# Patient Record
Sex: Male | Born: 1955
Health system: Southern US, Community
[De-identification: ages and names within clinical notes are randomized; demographics above are authoritative.]

## PROBLEM LIST (undated history)

## (undated) DIAGNOSIS — G43909 Migraine, unspecified, not intractable, without status migrainosus: Secondary | ICD-10-CM

## (undated) DIAGNOSIS — H539 Unspecified visual disturbance: Secondary | ICD-10-CM

## (undated) DIAGNOSIS — Z85820 Personal history of malignant melanoma of skin: Secondary | ICD-10-CM

## (undated) DIAGNOSIS — M1990 Unspecified osteoarthritis, unspecified site: Secondary | ICD-10-CM

## (undated) DIAGNOSIS — Z973 Presence of spectacles and contact lenses: Secondary | ICD-10-CM

## (undated) DIAGNOSIS — K219 Gastro-esophageal reflux disease without esophagitis: Secondary | ICD-10-CM

## (undated) DIAGNOSIS — M545 Low back pain, unspecified: Secondary | ICD-10-CM

## (undated) DIAGNOSIS — F419 Anxiety disorder, unspecified: Secondary | ICD-10-CM

## (undated) DIAGNOSIS — N400 Enlarged prostate without lower urinary tract symptoms: Secondary | ICD-10-CM

## (undated) DIAGNOSIS — I10 Essential (primary) hypertension: Secondary | ICD-10-CM

## (undated) DIAGNOSIS — G4700 Insomnia, unspecified: Secondary | ICD-10-CM

## (undated) DIAGNOSIS — C61 Malignant neoplasm of prostate: Secondary | ICD-10-CM

## (undated) DIAGNOSIS — G8929 Other chronic pain: Secondary | ICD-10-CM

## (undated) HISTORY — DX: Other chronic pain: G89.29

## (undated) HISTORY — PX: TONSILLECTOMY: SUR1361

## (undated) HISTORY — DX: Migraine, unspecified, not intractable, without status migrainosus: G43.909

## (undated) HISTORY — DX: Benign prostatic hyperplasia without lower urinary tract symptoms: N40.0

## (undated) HISTORY — DX: Low back pain, unspecified: M54.50

## (undated) HISTORY — DX: Anxiety disorder, unspecified: F41.9

## (undated) HISTORY — DX: Unspecified visual disturbance: H53.9

## (undated) HISTORY — DX: Personal history of malignant melanoma of skin: Z85.820

## (undated) HISTORY — PX: DERMOID CYST  EXCISION: SHX1452

## (undated) HISTORY — DX: Low back pain: M54.5

## (undated) HISTORY — PX: CYST EXCISION: SHX5701

## (undated) HISTORY — PX: FINGER GANGLION CYST EXCISION: SHX1636

## (undated) HISTORY — PX: HERNIA REPAIR: SHX51

## (undated) HISTORY — PX: ROTATOR CUFF REPAIR: SHX139

## (undated) HISTORY — PX: COLONOSCOPY: SHX174

## (undated) HISTORY — PX: EYE SURGERY: SHX253

---

## 1999-05-19 ENCOUNTER — Ambulatory Visit (HOSPITAL_BASED_OUTPATIENT_CLINIC_OR_DEPARTMENT_OTHER): Admission: RE | Admit: 1999-05-19 | Discharge: 1999-05-19 | Payer: Self-pay | Admitting: General Surgery

## 2008-01-26 ENCOUNTER — Encounter: Admission: RE | Admit: 2008-01-26 | Discharge: 2008-01-26 | Payer: Self-pay | Admitting: Family Medicine

## 2008-02-28 ENCOUNTER — Ambulatory Visit (HOSPITAL_BASED_OUTPATIENT_CLINIC_OR_DEPARTMENT_OTHER): Admission: RE | Admit: 2008-02-28 | Discharge: 2008-02-28 | Payer: Self-pay | Admitting: Plastic Surgery

## 2008-02-28 ENCOUNTER — Encounter (INDEPENDENT_AMBULATORY_CARE_PROVIDER_SITE_OTHER): Payer: Self-pay | Admitting: Plastic Surgery

## 2011-07-23 ENCOUNTER — Other Ambulatory Visit: Payer: Self-pay | Admitting: Otolaryngology

## 2011-07-23 DIAGNOSIS — K118 Other diseases of salivary glands: Secondary | ICD-10-CM

## 2011-07-24 ENCOUNTER — Ambulatory Visit
Admission: RE | Admit: 2011-07-24 | Discharge: 2011-07-24 | Disposition: A | Discharge status: Home / Self Care | Payer: Self-pay | Source: Ambulatory Visit | Attending: Otolaryngology | Admitting: Otolaryngology

## 2011-07-24 DIAGNOSIS — K118 Other diseases of salivary glands: Secondary | ICD-10-CM

## 2011-07-24 MED ORDER — IOHEXOL 300 MG/ML  SOLN
75.0000 mL | Freq: Once | INTRAMUSCULAR | Status: AC | PRN
  Administered 2011-07-24: 75 mL via INTRAVENOUS

## 2011-08-12 ENCOUNTER — Encounter (HOSPITAL_BASED_OUTPATIENT_CLINIC_OR_DEPARTMENT_OTHER): Payer: Self-pay | Admitting: *Deleted

## 2011-08-12 NOTE — Progress Notes (Signed)
No labs needed-went to a traveling cardiac testing bus-will bring ekg Bring meds and overnight bag

## 2011-08-13 NOTE — H&P (Signed)
PREOPERATIVE H&P  Chief Complaint: Left neck mass  HPI: Bradley Lambert is a 56 y.o. male who presents for evaluation of left neck mass. This was first noted about 2 months ago when he had a URI. It has persisted and perhaps gotten a little bit larger. Recent CT scan demonstrated a 11x15  mm size left inferior parotid mass or tumor. He's taken to the OR for excision of the parotid mass with facial nerve dissection.  Past Medical History  Diagnosis Date  . No pertinent past medical history   . GERD (gastroesophageal reflux disease)   . Insomnia    Past Surgical History  Procedure Date  . Tonsillectomy   . Dermoid cyst  excision   . Finger ganglion cyst excision   . Colonoscopy   . Eye surgery     radiocaratotomyx4   History   Social History  . Marital Status: Married    Spouse Name: N/A    Number of Children: N/A  . Years of Education: N/A   Social History Main Topics  . Smoking status: Never Smoker   . Smokeless tobacco: Not on file  . Alcohol Use: Yes     1-2 daily  . Drug Use: No  . Sexually Active:    Other Topics Concern  . Not on file   Social History Narrative  . No narrative on file   No family history on file. No Known Allergies Prior to Admission medications   Medication Sig Start Date End Date Taking? Authorizing Provider  aspirin 81 MG tablet Take 81 mg by mouth daily.   Yes Historical Provider, MD  Multiple Vitamins-Minerals (MULTIVITAMIN WITH MINERALS) tablet Take 1 tablet by mouth daily.   Yes Historical Provider, MD  omeprazole (PRILOSEC) 20 MG capsule Take 20 mg by mouth 2 (two) times a week.   Yes Historical Provider, MD  traZODone (DESYREL) 50 MG tablet Take 50 mg by mouth at bedtime.   Yes Historical Provider, MD     Positive ROS: negative  All other systems have been reviewed and were otherwise negative with the exception of those mentioned in the HPI and as above.  Physical Exam: There were no vitals filed for this visit.  General:  Alert, no acute distress Oral: Normal oral mucosa and tonsils Nasal: Clear nasal passages Neck: 1.5 cm mass in the region of the inferior tail of the L parotid gland Ear: Ear canal is clear with normal appearing TMs Cardiovascular: Regular rate and rhythm, no murmur.  Respiratory: Clear to auscultation Neurologic: Alert and oriented x 3   Assessment/Plan: LEFT PAROTID MASS Plan for Procedure(s) LEFT SUPERFICIAL PAROTIDECTOMY WITH FACIAL NERVE DISSECTION   Dillard Cannon, MD 08/13/2011 4:59 PM

## 2011-08-14 ENCOUNTER — Ambulatory Visit (HOSPITAL_BASED_OUTPATIENT_CLINIC_OR_DEPARTMENT_OTHER)
Admission: RE | Admit: 2011-08-14 | Discharge: 2011-08-14 | Disposition: A | Discharge status: Home / Self Care | Payer: PRIVATE HEALTH INSURANCE | Source: Ambulatory Visit | Attending: Otolaryngology | Admitting: Otolaryngology

## 2011-08-14 ENCOUNTER — Encounter (HOSPITAL_BASED_OUTPATIENT_CLINIC_OR_DEPARTMENT_OTHER): Payer: Self-pay | Admitting: Anesthesiology

## 2011-08-14 ENCOUNTER — Ambulatory Visit (HOSPITAL_BASED_OUTPATIENT_CLINIC_OR_DEPARTMENT_OTHER): Payer: PRIVATE HEALTH INSURANCE | Admitting: Anesthesiology

## 2011-08-14 ENCOUNTER — Encounter (HOSPITAL_BASED_OUTPATIENT_CLINIC_OR_DEPARTMENT_OTHER)
Admission: RE | Disposition: A | Discharge status: Home / Self Care | Payer: Self-pay | Source: Ambulatory Visit | Attending: Otolaryngology

## 2011-08-14 DIAGNOSIS — K219 Gastro-esophageal reflux disease without esophagitis: Secondary | ICD-10-CM | POA: Insufficient documentation

## 2011-08-14 DIAGNOSIS — D119 Benign neoplasm of major salivary gland, unspecified: Secondary | ICD-10-CM | POA: Insufficient documentation

## 2011-08-14 HISTORY — DX: Gastro-esophageal reflux disease without esophagitis: K21.9

## 2011-08-14 HISTORY — PX: PAROTIDECTOMY: SHX2163

## 2011-08-14 HISTORY — DX: Insomnia, unspecified: G47.00

## 2011-08-14 SURGERY — EXCISION, PAROTID GLAND
Anesthesia: General | Site: Neck | Laterality: Left | Wound class: Clean

## 2011-08-14 MED ORDER — LIDOCAINE-EPINEPHRINE 1 %-1:100000 IJ SOLN
INTRAMUSCULAR | Status: DC | PRN
  Administered 2011-08-14: 3 mL

## 2011-08-14 MED ORDER — SUCCINYLCHOLINE CHLORIDE 20 MG/ML IJ SOLN
INTRAMUSCULAR | Status: DC | PRN
  Administered 2011-08-14: 100 mg via INTRAVENOUS

## 2011-08-14 MED ORDER — BACITRACIN ZINC 500 UNIT/GM EX OINT
TOPICAL_OINTMENT | CUTANEOUS | Status: DC | PRN
  Administered 2011-08-14: 1 via TOPICAL

## 2011-08-14 MED ORDER — CEFAZOLIN SODIUM 1-5 GM-% IV SOLN
1.0000 g | Freq: Three times a day (TID) | INTRAVENOUS | Status: DC
  Administered 2011-08-14: 1 g via INTRAVENOUS

## 2011-08-14 MED ORDER — CEFAZOLIN SODIUM 1-5 GM-% IV SOLN
INTRAVENOUS | Status: DC | PRN
  Administered 2011-08-14: 2 g via INTRAVENOUS

## 2011-08-14 MED ORDER — LIDOCAINE HCL (CARDIAC) 20 MG/ML IV SOLN
INTRAVENOUS | Status: DC | PRN
  Administered 2011-08-14: 50 mg via INTRAVENOUS

## 2011-08-14 MED ORDER — HYDROMORPHONE HCL PF 1 MG/ML IJ SOLN
0.2500 mg | INTRAMUSCULAR | Status: DC | PRN
  Administered 2011-08-14: 0.5 mg via INTRAVENOUS

## 2011-08-14 MED ORDER — MIDAZOLAM HCL 2 MG/2ML IJ SOLN: 1.0000 mg | INTRAMUSCULAR | Status: DC | PRN

## 2011-08-14 MED ORDER — HYDROCODONE-ACETAMINOPHEN 5-500 MG PO TABS: 1.0000 | ORAL_TABLET | Freq: Four times a day (QID) | ORAL | Status: AC | PRN

## 2011-08-14 MED ORDER — LORAZEPAM 2 MG/ML IJ SOLN: 1.0000 mg | Freq: Once | INTRAMUSCULAR | Status: DC | PRN

## 2011-08-14 MED ORDER — FENTANYL CITRATE 0.05 MG/ML IJ SOLN
INTRAMUSCULAR | Status: DC | PRN
  Administered 2011-08-14: 50 ug via INTRAVENOUS
  Administered 2011-08-14: 25 ug via INTRAVENOUS
  Administered 2011-08-14 (×3): 50 ug via INTRAVENOUS

## 2011-08-14 MED ORDER — ONDANSETRON HCL 4 MG/2ML IJ SOLN
INTRAMUSCULAR | Status: DC | PRN
  Administered 2011-08-14: 4 mg via INTRAVENOUS

## 2011-08-14 MED ORDER — HYDROCODONE-ACETAMINOPHEN 5-325 MG PO TABS
1.0000 | ORAL_TABLET | ORAL | Status: DC | PRN
  Administered 2011-08-14: 1 via ORAL
  Administered 2011-08-14: 2 via ORAL

## 2011-08-14 MED ORDER — KCL IN DEXTROSE-NACL 20-5-0.45 MEQ/L-%-% IV SOLN
INTRAVENOUS | Status: DC
  Administered 2011-08-14: 12:00:00 via INTRAVENOUS

## 2011-08-14 MED ORDER — ACETAMINOPHEN 160 MG/5ML PO SOLN: 650.0000 mg | ORAL | Status: DC | PRN

## 2011-08-14 MED ORDER — LACTATED RINGERS IV SOLN
INTRAVENOUS | Status: DC
  Administered 2011-08-14 (×3): via INTRAVENOUS

## 2011-08-14 MED ORDER — CEPHALEXIN 500 MG PO CAPS: 500.0000 mg | ORAL_CAPSULE | Freq: Two times a day (BID) | ORAL | Status: AC

## 2011-08-14 MED ORDER — ACETAMINOPHEN 650 MG RE SUPP: 650.0000 mg | RECTAL | Status: DC | PRN

## 2011-08-14 MED ORDER — BACITRACIN ZINC 500 UNIT/GM EX OINT: 1.0000 "application " | TOPICAL_OINTMENT | Freq: Three times a day (TID) | CUTANEOUS | Status: DC

## 2011-08-14 MED ORDER — PROPOFOL 10 MG/ML IV EMUL
INTRAVENOUS | Status: DC | PRN
  Administered 2011-08-14: 200 mg via INTRAVENOUS

## 2011-08-14 MED ORDER — MIDAZOLAM HCL 5 MG/5ML IJ SOLN
INTRAMUSCULAR | Status: DC | PRN
  Administered 2011-08-14: 2 mg via INTRAVENOUS

## 2011-08-14 MED ORDER — FENTANYL CITRATE 0.05 MG/ML IJ SOLN: 50.0000 ug | INTRAMUSCULAR | Status: DC | PRN

## 2011-08-14 MED ORDER — DEXAMETHASONE SODIUM PHOSPHATE 4 MG/ML IJ SOLN
INTRAMUSCULAR | Status: DC | PRN
  Administered 2011-08-14: 10 mg via INTRAVENOUS

## 2011-08-14 SURGICAL SUPPLY — 73 items
APPLICATOR COTTON TIP 6IN STRL (MISCELLANEOUS) ×2 IMPLANT
ATTRACTOMAT 16X20 MAGNETIC DRP (DRAPES) ×2 IMPLANT
BENZOIN TINCTURE PRP APPL 2/3 (GAUZE/BANDAGES/DRESSINGS) IMPLANT
BLADE SURG 12 STRL SS (BLADE) ×2 IMPLANT
BLADE SURG 15 STRL LF DISP TIS (BLADE) ×1 IMPLANT
BLADE SURG 15 STRL SS (BLADE) ×1
CANISTER SUCTION 1200CC (MISCELLANEOUS) ×2 IMPLANT
CLEANER CAUTERY TIP 5X5 PAD (MISCELLANEOUS) IMPLANT
CLOTH BEACON ORANGE TIMEOUT ST (SAFETY) ×2 IMPLANT
CORDS BIPOLAR (ELECTRODE) ×2 IMPLANT
COTTONBALL LRG STERILE PKG (GAUZE/BANDAGES/DRESSINGS) ×2 IMPLANT
COVER MAYO STAND STRL (DRAPES) ×2 IMPLANT
COVER TABLE BACK 60X90 (DRAPES) ×2 IMPLANT
DECANTER SPIKE VIAL GLASS SM (MISCELLANEOUS) IMPLANT
DERMABOND ADVANCED (GAUZE/BANDAGES/DRESSINGS)
DERMABOND ADVANCED .7 DNX12 (GAUZE/BANDAGES/DRESSINGS) IMPLANT
DRAIN CHANNEL 7F FF FLAT (WOUND CARE) IMPLANT
DRAIN JACKSON RD 7FR 3/32 (WOUND CARE) IMPLANT
DRAIN JP 10F RND SILICONE (MISCELLANEOUS) ×2 IMPLANT
DRAIN PENROSE 1/4X12 LTX STRL (WOUND CARE) IMPLANT
DRAIN SNY WOU 7FLT (WOUND CARE) IMPLANT
DRAPE SURG 17X23 STRL (DRAPES) ×2 IMPLANT
DRAPE U-SHAPE 76X120 STRL (DRAPES) ×2 IMPLANT
ELECT COATED BLADE 2.86 ST (ELECTRODE) ×2 IMPLANT
ELECT REM PT RETURN 9FT ADLT (ELECTROSURGICAL) ×2
ELECTRODE REM PT RTRN 9FT ADLT (ELECTROSURGICAL) ×1 IMPLANT
EVACUATOR SILICONE 100CC (DRAIN) ×2 IMPLANT
GAUZE SPONGE 4X4 12PLY STRL LF (GAUZE/BANDAGES/DRESSINGS) IMPLANT
GAUZE SPONGE 4X4 16PLY XRAY LF (GAUZE/BANDAGES/DRESSINGS) ×2 IMPLANT
GLOVE ECLIPSE 6.5 STRL STRAW (GLOVE) ×2 IMPLANT
GLOVE INDICATOR 7.0 STRL GRN (GLOVE) ×2 IMPLANT
GLOVE SKINSENSE NS SZ7.5 (GLOVE)
GLOVE SKINSENSE STRL SZ7.5 (GLOVE) IMPLANT
GLOVE SS BIOGEL STRL SZ 7.5 (GLOVE) ×1 IMPLANT
GLOVE SUPERSENSE BIOGEL SZ 7.5 (GLOVE) ×1
GOWN PREVENTION PLUS XLARGE (GOWN DISPOSABLE) ×2 IMPLANT
GOWN PREVENTION PLUS XXLARGE (GOWN DISPOSABLE) ×2 IMPLANT
LOCATOR NERVE 3 VOLT (DISPOSABLE) IMPLANT
NEEDLE HYPO 25X1 1.5 SAFETY (NEEDLE) ×2 IMPLANT
NS IRRIG 1000ML POUR BTL (IV SOLUTION) ×2 IMPLANT
PACK BASIN DAY SURGERY FS (CUSTOM PROCEDURE TRAY) ×2 IMPLANT
PAD CLEANER CAUTERY TIP 5X5 (MISCELLANEOUS)
PENCIL BUTTON HOLSTER BLD 10FT (ELECTRODE) ×2 IMPLANT
PIN SAFETY STERILE (MISCELLANEOUS) IMPLANT
SHEET MEDIUM DRAPE 40X70 STRL (DRAPES) IMPLANT
SLEEVE SCD COMPRESS KNEE MED (MISCELLANEOUS) ×2 IMPLANT
SPONGE INTESTINAL PEANUT (DISPOSABLE) ×2 IMPLANT
STAPLER VISISTAT 35W (STAPLE) IMPLANT
STRIP CLOSURE SKIN 1/2X4 (GAUZE/BANDAGES/DRESSINGS) IMPLANT
SUCTION FRAZIER TIP 10 FR DISP (SUCTIONS) IMPLANT
SUT CHROMIC 3 0 PS 2 (SUTURE) ×2 IMPLANT
SUT CHROMIC 3 0 TIES (SUTURE) IMPLANT
SUT ETHILON 3 0 PS 1 (SUTURE) ×2 IMPLANT
SUT ETHILON 4 0 PS 2 18 (SUTURE) IMPLANT
SUT ETHILON 5 0 P 3 18 (SUTURE) ×1
SUT NYLON ETHILON 5-0 P-3 1X18 (SUTURE) ×1 IMPLANT
SUT SILK 2 0 FS (SUTURE) ×6 IMPLANT
SUT SILK 2 0 TIES 17X18 (SUTURE) ×1
SUT SILK 2-0 18XBRD TIE BLK (SUTURE) ×1 IMPLANT
SUT SILK 3 0 PS 1 (SUTURE) IMPLANT
SUT SILK 3 0 SH 30 (SUTURE) IMPLANT
SUT SILK 3 0 TIES 17X18 (SUTURE)
SUT SILK 3-0 18XBRD TIE BLK (SUTURE) IMPLANT
SUT SILK 4 0 TIES 17X18 (SUTURE) ×2 IMPLANT
SWAB CULTURE LIQ STUART DBL (MISCELLANEOUS) IMPLANT
SYR BULB 3OZ (MISCELLANEOUS) ×2 IMPLANT
SYR CONTROL 10ML LL (SYRINGE) ×2 IMPLANT
TAPE CLOTH SURG 4X10 WHT LF (GAUZE/BANDAGES/DRESSINGS) ×2 IMPLANT
TOWEL OR 17X24 6PK STRL BLUE (TOWEL DISPOSABLE) ×4 IMPLANT
TRAY DSU PREP LF (CUSTOM PROCEDURE TRAY) ×2 IMPLANT
TUBE ANAEROBIC SPECIMEN COL (MISCELLANEOUS) IMPLANT
TUBE CONNECTING 20X1/4 (TUBING) ×2 IMPLANT
WATER STERILE IRR 1000ML POUR (IV SOLUTION) IMPLANT

## 2011-08-14 NOTE — Transfer of Care (Signed)
Immediate Anesthesia Transfer of Care Note  Patient: Bradley Lambert  Procedure(s) Performed: Procedure(s) (LRB): PAROTIDECTOMY (Left)  Patient Location: PACU  Anesthesia Type: General  Level of Consciousness: awake, alert  and oriented  Airway & Oxygen Therapy: Patient Spontanous Breathing and Patient connected to face mask oxygen  Post-op Assessment: Report given to PACU RN and Post -op Vital signs reviewed and stable  Post vital signs: Reviewed and stable  Complications: No apparent anesthesia complications

## 2011-08-14 NOTE — Anesthesia Preprocedure Evaluation (Signed)
Anesthesia Evaluation  Patient identified by MRN, date of birth, ID band Patient awake    Reviewed: Allergy & Precautions, H&P , NPO status , Patient's Chart, lab work & pertinent test results  Airway Mallampati: I TM Distance: >3 FB Neck ROM: Full    Dental   Pulmonary    Pulmonary exam normal       Cardiovascular     Neuro/Psych    GI/Hepatic GERD-  Medicated and Controlled,  Endo/Other    Renal/GU      Musculoskeletal   Abdominal   Peds  Hematology   Anesthesia Other Findings   Reproductive/Obstetrics                           Anesthesia Physical Anesthesia Plan  ASA: II  Anesthesia Plan: General   Post-op Pain Management:    Induction: Intravenous  Airway Management Planned: Oral ETT  Additional Equipment:   Intra-op Plan:   Post-operative Plan: Extubation in OR  Informed Consent: I have reviewed the patients History and Physical, chart, labs and discussed the procedure including the risks, benefits and alternatives for the proposed anesthesia with the patient or authorized representative who has indicated his/her understanding and acceptance.     Plan Discussed with: CRNA and Surgeon  Anesthesia Plan Comments:         Anesthesia Quick Evaluation

## 2011-08-14 NOTE — Interval H&P Note (Signed)
History and Physical Interval Note:  08/14/2011 7:37 AM  Bradley Lambert  has presented today for surgery, with the diagnosis of PAROTID MASS  The various methods of treatment have been discussed with the patient and family. After consideration of risks, benefits and other options for treatment, the patient has consented to  Procedure(s) (LRB): PAROTIDECTOMY (Left) as a surgical intervention .  The patient's history has been reviewed, patient examined, no change in status, stable for surgery.  I have reviewed the patients' chart and labs.  Questions were answered to the patient's satisfaction.     [name]

## 2011-08-14 NOTE — Discharge Instructions (Addendum)
Keep incision clean and apply antibiotic ointment to  incision site. Keflex BID for 5 days Tylenol or Vicodin prn pain Call office for follow up in 6-7 days      Post Anesthesia Home Care Instructions  Activity: Get plenty of rest for the remainder of the day. A responsible adult should stay with you for 24 hours following the procedure.  For the next 24 hours, DO NOT: -Drive a car -Advertising copywriter -Drink alcoholic beverages -Take any medication unless instructed by your physician -Make any legal decisions or sign important papers.  Meals: Start with liquid foods such as gelatin or soup. Progress to regular foods as tolerated. Avoid greasy, spicy, heavy foods. If nausea and/or vomiting occur, drink only clear liquids until the nausea and/or vomiting subsides. Call your physician if vomiting continues.  Special Instructions/Symptoms: Your throat may feel dry or sore from the anesthesia or the breathing tube placed in your throat during surgery. If this causes discomfort, gargle with warm salt water. The discomfort should disappear within 24 hours.    Call your surgeon if you experience:   1.  Fever over 101.0. 2.  Inability to urinate. 3.  Nausea and/or vomiting. 4.  Extreme swelling or bruising at the surgical site. 5.  Continued bleeding from the incision. 6.  Increased pain, redness or drainage from the incision. 7.  Problems related to your pain medication.

## 2011-08-14 NOTE — Op Note (Signed)
NAMEQUANTE, PETTRY NO.:  [id number]  MEDICAL RECORD NO.:  [id number]  LOCATION:                                 FACILITY:  PHYSICIAN:  Kristine Garbe. Ezzard Standing, M.D. DATE OF BIRTH:  DATE OF PROCEDURE:  08/14/2011 DATE OF DISCHARGE:                              OPERATIVE REPORT   PREOPERATIVE DIAGNOSIS:  Left parotid mass.  POSTOPERATIVE DIAGNOSIS:  Left parotid mass.  OPERATION:  Left superficial parotidectomy with facial nerve dissection. Excision of left inferior parotid mass.  SURGEON:  Kristine Garbe. Ezzard Standing, MD  ANESTHESIA:  General endotracheal.  COMPLICATIONS:  None.  DRAINS:  A 10-French JP drain.  BRIEF CLINICAL NOTE:  Bradley Lambert is a 56 year old gentleman who initially noted a node in the left neck about 3 months ago.  It is gradually gotten a little bit larger.  Followup CT scan revealed a probable parotid mass in the inferior aspect of the parotid gland just inferior and posterior to the angle of the jaw.  It measured approximately 10 x 15 mm in size.  He was taken to the operative room at this time for superficial parotidectomy and facial nerve dissection with excision of the left inferior parotid mass.  DESCRIPTION OF PROCEDURE:  After adequate endotracheal anesthesia, left parotid incision was marked out and injected with Xylocaine with epinephrine for local anesthetic and hemostasis.  Standard parotid incision was made around the earlobe and extended down to the neck. First, flap was elevated anteriorly, inferiorly over the parotid fascia. The mass was little bit difficult to palpate, but was located just inferior to the angle of the jaw.  Next, dissection was carried down in front of the ear cartilage down to identify the facial nerve.  The trunk of the facial nerve was identified in its normal anatomical position just inferior and lateral to the styloid process.  The trunk of the facial nerve was dissected out in the inferior  branch, which passed in close proximity to the mass was followed and dissected out.  Hemostasis was obtained with 4-0 silk ligatures and bipolar cautery.  The mass was just lateral to the posterior facial vein, was encapsulated and located at the anterior inferior aspect of the left parotid gland.  The inferior aspect of the left parotid gland was resected along with the mass.  A single stitch was placed to the specimen to identify the inferior aspect of the parotid gland.  The specimen was sent as saline fresh to pathology.  Wound was irrigated with saline.  Hemostasis was obtained with bipolar cautery, and then a 10-French round perforated JP drain was brought through a separate stab incision posteriorly and the defect was closed with 3-0 chromic sutures subcutaneously and 5-0 nylon reapproximated the skin edges.  Bacitracin ointment and dressing was applied. Eon will be observed this afternoon and probable discharge this evening or tomorrow a.m. depending on JP output.  He will be discharged home on Vicodin 1-2 q.4 hours p.r.n. pain and Tylenol for pain.  I will have him follow up in my office in 1 week for recheck and to review pathology.          ______________________________ Kristine Garbe Ezzard Standing,  M.D.     CEN/MEDQ  D:  08/14/2011  T:  08/14/2011  Job:  [id number]  cc:   Caryn Bee L. Little, M.D.

## 2011-08-14 NOTE — Anesthesia Postprocedure Evaluation (Signed)
  Anesthesia Post-op Note  Patient: Bradley Lambert  Procedure(s) Performed: Procedure(s) (LRB): PAROTIDECTOMY (Left)  Patient Location: PACU  Anesthesia Type: General  Level of Consciousness: awake  Airway and Oxygen Therapy: Patient Spontanous Breathing  Post-op Pain: mild  Post-op Assessment: Post-op Vital signs reviewed, Patient's Cardiovascular Status Stable, Respiratory Function Stable and Patent Airway  Post-op Vital Signs: Reviewed and stable  Complications: No apparent anesthesia complications

## 2011-08-14 NOTE — Anesthesia Procedure Notes (Signed)
Procedure Name: Intubation Date/Time: 08/14/2011 7:52 AM Performed by: Zenia Resides D Pre-anesthesia Checklist: Patient identified, Emergency Drugs available, Suction available, Patient being monitored and Timeout performed Patient Re-evaluated:Patient Re-evaluated prior to inductionOxygen Delivery Method: Circle System Utilized Preoxygenation: Pre-oxygenation with 100% oxygen Intubation Type: IV induction Ventilation: Mask ventilation without difficulty Laryngoscope Size: Mac and 3 Grade View: Grade I Tube type: Oral Number of attempts: 1 Airway Equipment and Method: stylet and oral airway Placement Confirmation: ETT inserted through vocal cords under direct vision,  positive ETCO2 and breath sounds checked- equal and bilateral Secured at: 23 cm Tube secured with: Tape Dental Injury: Teeth and Oropharynx as per pre-operative assessment

## 2011-08-14 NOTE — Progress Notes (Signed)
Doing well post op with no complaints. Normal facial nerve function. JP drain with minimal serous effusion.  [facility]

## 2011-08-14 NOTE — Brief Op Note (Signed)
08/14/2011  10:35 AM  PATIENT:  Bradley Lambert  56 y.o. male  PRE-OPERATIVE DIAGNOSIS:  LEFT PAROTID MASS  POST-OPERATIVE DIAGNOSIS:  LEFT PAROTID MASS  PROCEDURE:  Procedure(s) (LRB): PAROTIDECTOMY (Left)  SURGEON:  Surgeon(s) and Role:    * Drema Halon, MD - Primary  PHYSICIAN ASSISTANT:   ASSISTANTS: none   ANESTHESIA:   general  EBL:  Total I/O In: 2000 [I.V.:2000] Out: -   BLOOD ADMINISTERED:none  DRAINS: (16fr) Jackson-Pratt drain(s) with closed bulb suction in the    LOCAL MEDICATIONS USED:  NONE  SPECIMEN:  Source of Specimen:  L inferior parotid mass  DISPOSITION OF SPECIMEN:  PATHOLOGY  COUNTS:  YES  TOURNIQUET:  * No tourniquets in log *  DICTATION: .Other Dictation: Dictation Number [id number]  PLAN OF CARE: Admit for overnight observation  PATIENT DISPOSITION:  PACU - hemodynamically stable.   Delay start of Pharmacological VTE agent (>24hrs) due to surgical blood loss or risk of bleeding: yes

## 2011-08-17 NOTE — Anesthesia Postprocedure Evaluation (Signed)
  Anesthesia Post-op Note  Patient: Bradley Lambert  Procedure(s) Performed: Procedure(s) (LRB): PAROTIDECTOMY (Left)  Patient Location: PACU  Anesthesia Type: General  Level of Consciousness: awake and alert   Airway and Oxygen Therapy: Patient Spontanous Breathing  Post-op Pain: mild  Post-op Assessment: Post-op Vital signs reviewed, Patient's Cardiovascular Status Stable, Respiratory Function Stable, Patent Airway, No signs of Nausea or vomiting and Pain level controlled  Post-op Vital Signs: stable  Complications: No apparent anesthesia complications

## 2011-08-18 ENCOUNTER — Encounter (HOSPITAL_BASED_OUTPATIENT_CLINIC_OR_DEPARTMENT_OTHER): Payer: Self-pay | Admitting: Otolaryngology

## 2012-08-04 ENCOUNTER — Other Ambulatory Visit: Payer: Self-pay | Admitting: Family Medicine

## 2012-08-04 DIAGNOSIS — M549 Dorsalgia, unspecified: Secondary | ICD-10-CM

## 2012-08-09 ENCOUNTER — Ambulatory Visit
Admission: RE | Admit: 2012-08-09 | Discharge: 2012-08-09 | Disposition: A | Discharge status: Home / Self Care | Payer: PRIVATE HEALTH INSURANCE | Source: Ambulatory Visit | Attending: Family Medicine | Admitting: Family Medicine

## 2012-08-09 DIAGNOSIS — M549 Dorsalgia, unspecified: Secondary | ICD-10-CM

## 2012-09-01 ENCOUNTER — Other Ambulatory Visit (HOSPITAL_COMMUNITY): Payer: Self-pay | Admitting: Urology

## 2012-09-01 DIAGNOSIS — R972 Elevated prostate specific antigen [PSA]: Secondary | ICD-10-CM

## 2012-09-15 ENCOUNTER — Ambulatory Visit (HOSPITAL_COMMUNITY): Payer: PRIVATE HEALTH INSURANCE

## 2012-09-15 ENCOUNTER — Ambulatory Visit (HOSPITAL_COMMUNITY)
Admission: RE | Admit: 2012-09-15 | Discharge: 2012-09-15 | Disposition: A | Discharge status: Home / Self Care | Payer: PRIVATE HEALTH INSURANCE | Source: Ambulatory Visit | Attending: Urology | Admitting: Urology

## 2012-09-15 DIAGNOSIS — R972 Elevated prostate specific antigen [PSA]: Secondary | ICD-10-CM | POA: Insufficient documentation

## 2012-09-15 DIAGNOSIS — K4090 Unilateral inguinal hernia, without obstruction or gangrene, not specified as recurrent: Secondary | ICD-10-CM | POA: Insufficient documentation

## 2012-09-15 DIAGNOSIS — N400 Enlarged prostate without lower urinary tract symptoms: Secondary | ICD-10-CM | POA: Insufficient documentation

## 2012-09-15 MED ORDER — GADOBENATE DIMEGLUMINE 529 MG/ML IV SOLN
20.0000 mL | Freq: Once | INTRAVENOUS | Status: AC | PRN
  Administered 2012-09-15: 20 mL via INTRAVENOUS

## 2012-11-08 ENCOUNTER — Other Ambulatory Visit: Payer: Self-pay | Admitting: Urology

## 2012-11-29 ENCOUNTER — Encounter (HOSPITAL_COMMUNITY): Payer: Self-pay | Admitting: Pharmacy Technician

## 2012-12-01 ENCOUNTER — Ambulatory Visit (HOSPITAL_COMMUNITY)
Admission: RE | Admit: 2012-12-01 | Discharge: 2012-12-01 | Disposition: A | Discharge status: Home / Self Care | Payer: PRIVATE HEALTH INSURANCE | Source: Ambulatory Visit | Attending: Urology | Admitting: Urology

## 2012-12-01 ENCOUNTER — Encounter (HOSPITAL_COMMUNITY): Payer: Self-pay

## 2012-12-01 ENCOUNTER — Encounter (HOSPITAL_COMMUNITY)
Admission: RE | Admit: 2012-12-01 | Discharge: 2012-12-01 | Disposition: A | Discharge status: Home / Self Care | Payer: PRIVATE HEALTH INSURANCE | Source: Ambulatory Visit | Attending: Urology | Admitting: Urology

## 2012-12-01 DIAGNOSIS — C61 Malignant neoplasm of prostate: Secondary | ICD-10-CM | POA: Insufficient documentation

## 2012-12-01 DIAGNOSIS — Z01812 Encounter for preprocedural laboratory examination: Secondary | ICD-10-CM | POA: Insufficient documentation

## 2012-12-01 DIAGNOSIS — Z01810 Encounter for preprocedural cardiovascular examination: Secondary | ICD-10-CM | POA: Insufficient documentation

## 2012-12-01 DIAGNOSIS — Z01818 Encounter for other preprocedural examination: Secondary | ICD-10-CM | POA: Insufficient documentation

## 2012-12-01 HISTORY — PX: MELANOMA EXCISION: SHX5266

## 2012-12-01 HISTORY — DX: Essential (primary) hypertension: I10

## 2012-12-01 HISTORY — DX: Unspecified osteoarthritis, unspecified site: M19.90

## 2012-12-01 HISTORY — DX: Malignant neoplasm of prostate: C61

## 2012-12-01 LAB — BASIC METABOLIC PANEL
BUN: 15 mg/dL (ref 6–23)
CO2: 24 mEq/L (ref 19–32)
Chloride: 104 mEq/L (ref 96–112)
Glucose, Bld: 101 mg/dL — ABNORMAL HIGH (ref 70–99)
Potassium: 3.9 mEq/L (ref 3.5–5.1)
Sodium: 138 mEq/L (ref 135–145)

## 2012-12-01 LAB — CBC
HCT: 40.9 % (ref 39.0–52.0)
Hemoglobin: 14.5 g/dL (ref 13.0–17.0)
MCH: 31.8 pg (ref 26.0–34.0)
MCHC: 35.5 g/dL (ref 30.0–36.0)
MCV: 89.7 fL (ref 78.0–100.0)
RBC: 4.56 MIL/uL (ref 4.22–5.81)

## 2012-12-01 NOTE — Patient Instructions (Addendum)
Bradley Lambert  12/01/2012   Your procedure is scheduled on:  10-16 -2014  Report to [facility] at     0800   AM .  Call this number if you have problems the morning of surgery: [phone]  Or Presurgical Testing [phone](Tyner Codner)   Remember: Follow any bowel prep instructions per MD office.    Do not eat food:After Midnight.    Take these medicines the morning of surgery with A SIP OF WATER: Omeprazole.   Do not wear jewelry, make-up or nail polish.  Do not wear lotions, powders, or perfumes. You may wear deodorant.  Do not shave 12 hours prior to first CHG shower(legs and under arms).(face and neck okay.)  Do not bring valuables to the hospital.  Contacts, dentures or bridgework,body piercing,  may not be worn into surgery.  Leave suitcase in the car. After surgery it may be brought to your room.  For patients admitted to the hospital, checkout time is 11:00 AM the day of discharge.   Patients discharged the day of surgery will not be allowed to drive home. Must have responsible person with you x 24 hours once discharged.  Name and phone number of your driver: Jerelyn Charles 161- [phone] cell  Special Instructions: CHG(Chlorhedine 4%-"Hibiclens","Betasept","Aplicare") Shower Use Special Wash: see special instructions.(avoid face and genitals)   Please read over the following fact sheets that you were given:  Transfusion fact sheet, Incentive Spirometry Instruction.    Failure to follow these instructions may result in Cancellation of your surgery.   Patient signature_______________________________________________________

## 2012-12-01 NOTE — Pre-Procedure Instructions (Signed)
12-01-12 EKg/CXR done today

## 2012-12-07 NOTE — H&P (Signed)
Chief Complaint  Prostate Cancer   Reason For Visit  Reason for consult: To discuss treatment options for prostate cancer and specifically to consider a robotic prostatectomy. Physician requesting consult: Dr. Karma Greaser PCP: Dr. Catha Gosselin   History of Present Illness  [name] is a 57 year old who was noted to have an elevated PSA of 5.6 which prompted a prostate biopsy on 05/12/12 which demonstrated atypical glands suspicious for malignancy at the right apex. An MRI of the prostate was performed on 09/15/12 and indicated a concerning lesion at the right medial base with indeterminate right seminal vesicle invasion. A repeat biopsy was performed on 10/26/12 with additional cores taken on the right side of the prostate based on his prior atypia and MRI findings. This demonstrated 5 out of 14 cores positive for Gleason 3+3=6 adenocarcinoma. He has no family history of prostate cancer. He has no major medical comorbidities.  He has a history of BPH with a prostate volume measuring between 98 and 130 cc based on his prostate ultrasound measurments.  He has had voiding symptoms and has been treated with various alpha blockers and 5 alpha reductase inhibitors but stopped all these medications due to retrograde ejaculation.  He did undergo a laparoscopic hernia repair approximately 15 years ago.  TNM stage: cT1c Nx Mx PSA: 5.6 Gleason score: 3+3=6 Biopsy (10/26/12): 5/14 cores positive -- R apex (2/2 cores - <5%, <5%), R mid (5%), R lateral mid (15%), R lateral base (30%, PNI) Prostate volume: 98 cc  Nomogram OC disease: 87% EPE: 9% SVI: 1% LNI: 1.4% PFS (surgery): 97% at 5 years, 96% at 10 years  Urinary function: He does have significant baseline voiding symptoms including a sense of incomplete emptying, urinary frequency, weak stream, urgency, straining, and nocturia.  He has previously been treated with multiple medical therapies and did receive mild benefit felt the side effects were  not worth the benefit.  IPSS is 28. Erectile function: He denies erectile dysfunction.  SHIM score is 23.   Past Medical History Problems  1. History of  Arthritis V13.4 2. History of  Hypertension 401.9 3. History of  Skin Cancer V10.83  Surgical History Problems  1. History of  Hernia Repair Bilateral 2. History of  Laparoscopy Repair Of Hernia  Current Meds 1. Aspirin 81 MG Oral Tablet; Therapy: (Recorded:25Feb2014) to 2. Losartan Potassium 100 MG Oral Tablet; Therapy: (Recorded:25Feb2014) to 3. Omeprazole 20 MG Oral Capsule Delayed Release; Therapy: (Recorded:25Feb2014) to 4. TraZODone HCl 50 MG Oral Tablet; Therapy: (Recorded:25Feb2014) to  Allergies Medication  1. No Known Drug Allergies  Family History Problems  1. Family history of  Diabetes Mellitus V18.0 2. Paternal history of  Esophageal Cancer V16.0 Denied  3. Family history of  Prostate Cancer  Social History Problems    Alcohol Use 3 per day   Marital History - Currently Married   Never A Smoker   Occupation: Advice worker  Review of Systems Genitourinary, constitutional, skin, eye, otolaryngeal, hematologic/lymphatic, cardiovascular, pulmonary, endocrine, musculoskeletal, gastrointestinal, neurological and psychiatric system(s) were reviewed and pertinent findings if present are noted.    Vitals Vital Signs [Data Includes: Last 1 Day]  16Sep2014 08:00AM  Blood Pressure: 136 / 89 Heart Rate: 79  Physical Exam Constitutional: Well nourished and well developed . No acute distress.  ENT:. The ears and nose are normal in appearance.  Neck: The appearance of the neck is normal and no neck mass is present.  Pulmonary: No respiratory distress, normal respiratory rhythm and  effort and clear bilateral breath sounds.  Cardiovascular: Heart rate and rhythm are normal . No peripheral edema.  Abdomen: periumbilical incision site(s) well healed. The abdomen is soft and nontender. No masses are palpated.  No CVA tenderness. No hernias are palpable. No hepatosplenomegaly noted.  Rectal: Rectal exam demonstrates normal sphincter tone, no tenderness and no masses. Prostate size is estimated to be 80 g. The prostate has no nodularity and is not tender. The left seminal vesicle is nonpalpable. The right seminal vesicle is nonpalpable. The perineum is normal on inspection.  Lymphatics: The femoral and inguinal nodes are not enlarged or tender.  Skin: Normal skin turgor, no visible rash and no visible skin lesions.  Neuro/Psych:. Mood and affect are appropriate.    Results/Data Urine [Data Includes: Last 1 Day]   16Sep2014  COLOR YELLOW   APPEARANCE CLEAR   SPECIFIC GRAVITY 1.020   pH 5.5   GLUCOSE NEG mg/dL  BILIRUBIN NEG   KETONE NEG mg/dL  BLOOD LARGE   PROTEIN NEG mg/dL  UROBILINOGEN 0.2 mg/dL  NITRITE NEG   LEUKOCYTE ESTERASE NEG   SQUAMOUS EPITHELIAL/HPF RARE   WBC NONE SEEN WBC/hpf  RBC 3-6 RBC/hpf  BACTERIA RARE   CRYSTALS NONE SEEN   CASTS NONE SEEN     I have reviewed his medical records, PSA results, and pathology report.  I also independently reviewed his MRI from July.  Findings are as dictated above.   Assessment Assessed  1. Prostate Cancer 185  Plan Health Maintenance (V70.0)  1. UA With REFLEX  Done: 16Sep2014 07:52AM Prostate Cancer (185)  2. Follow-up Schedule Surgery Office  Follow-up  Done: 16Sep2014 3. PT/OT Referral Referral  Referral  Requested for: 23Sep2014  Discussion/Summary  1.  Prostate cancer: I had a detailed discussion with Mr. Garraway and his wife today.  Considering his volume of disease, I did recommend therapy of curative intent.  He does have a significantly enlarged prostate with significant voiding symptoms and I therefore favored surgical therapy over radiation therapy for this reason.  He would like to proceed with surgical therapy and will be tentatively scheduled for a bilateral nerve sparing robotic-assisted laparoscopic radical  prostatectomy.  He understands the potential risks associated with his prior laparoscopic hernia repair and slightly increased risk of open surgical conversion, bladder injury, and extended operative time that may be related to his prior surgical history.   The patient was counseled about the natural history of prostate cancer and the standard treatment options that are available for prostate cancer. It was explained to him how his age and life expectancy, clinical stage, Gleason score, and PSA affect his prognosis, the decision to proceed with additional staging studies, as well as how that information influences recommended treatment strategies. We discussed the roles for active surveillance, radiation therapy, surgical therapy, androgen deprivation, as well as ablative therapy options for the treatment of prostate cancer as appropriate to his individual cancer situation. We discussed the risks and benefits of these options with regard to their impact on cancer control and also in terms of potential adverse events, complications, and impact on quiality of life particularly related to urinary, bowel, and sexual function. The patient was encouraged to ask questions throughout the discussion today and all questions were answered to his stated satisfaction. In addition, the patient was provided with and/or directed to appropriate resources and literature for further education about prostate cancer and treatment options.   We discussed surgical therapy for prostate cancer including the different available surgical  approaches. We discussed, in detail, the risks and expectations of surgery with regard to cancer control, urinary control, and erectile function as well as the expected postoperative recovery process. Additional risks of surgery including but not limited to bleeding, infection, hernia formation, nerve damage, lymphocele formation, bowel/rectal injury potentially necessitating colostomy, damage to the  urinary tract resulting in urine leakage, urethral stricture, and the cardiopulmonary risks such as myocardial infarction, stroke, death, venothromboembolism, etc. were explained. The risk of open surgical conversion for robotic/laparoscopic prostatectomy was also discussed.   Cc: Dr. Jerilee Field Dr. Catha Gosselin    SignaturesElectronically signed by : Heloise Purpura, M.D.; Nov 08 2012  6:34PM

## 2012-12-08 ENCOUNTER — Ambulatory Visit (HOSPITAL_COMMUNITY): Payer: PRIVATE HEALTH INSURANCE | Admitting: Anesthesiology

## 2012-12-08 ENCOUNTER — Encounter (HOSPITAL_COMMUNITY): Payer: PRIVATE HEALTH INSURANCE | Admitting: Anesthesiology

## 2012-12-08 ENCOUNTER — Encounter (HOSPITAL_COMMUNITY): Payer: Self-pay | Admitting: *Deleted

## 2012-12-08 ENCOUNTER — Encounter (HOSPITAL_COMMUNITY)
Admission: RE | Disposition: A | Discharge status: Home / Self Care | Payer: Self-pay | Source: Ambulatory Visit | Attending: Urology

## 2012-12-08 ENCOUNTER — Inpatient Hospital Stay (HOSPITAL_COMMUNITY)
Admission: RE | Admit: 2012-12-08 | Discharge: 2012-12-09 | DRG: 708 | Disposition: A | Discharge status: Home / Self Care | Payer: PRIVATE HEALTH INSURANCE | Source: Ambulatory Visit | Attending: Urology | Admitting: Urology

## 2012-12-08 DIAGNOSIS — C61 Malignant neoplasm of prostate: Principal | ICD-10-CM | POA: Diagnosis present

## 2012-12-08 DIAGNOSIS — Z79899 Other long term (current) drug therapy: Secondary | ICD-10-CM

## 2012-12-08 DIAGNOSIS — Z7982 Long term (current) use of aspirin: Secondary | ICD-10-CM

## 2012-12-08 DIAGNOSIS — I10 Essential (primary) hypertension: Secondary | ICD-10-CM | POA: Diagnosis present

## 2012-12-08 HISTORY — PX: ROBOT ASSISTED LAPAROSCOPIC RADICAL PROSTATECTOMY: SHX5141

## 2012-12-08 LAB — TYPE AND SCREEN: ABO/RH(D): AB NEG

## 2012-12-08 LAB — HEMOGLOBIN AND HEMATOCRIT, BLOOD: Hemoglobin: 13.6 g/dL (ref 13.0–17.0)

## 2012-12-08 SURGERY — ROBOTIC ASSISTED LAPAROSCOPIC RADICAL PROSTATECTOMY LEVEL 3
Anesthesia: General | Site: Abdomen | Wound class: Clean

## 2012-12-08 MED ORDER — MIDAZOLAM HCL 5 MG/5ML IJ SOLN
INTRAMUSCULAR | Status: DC | PRN
  Administered 2012-12-08: 2 mg via INTRAVENOUS

## 2012-12-08 MED ORDER — BELLADONNA ALKALOIDS-OPIUM 16.2-60 MG RE SUPP
1.0000 | Freq: Four times a day (QID) | RECTAL | Status: DC | PRN
  Administered 2012-12-08: 1 via RECTAL
  Filled 2012-12-08: qty 1

## 2012-12-08 MED ORDER — INDIGOTINDISULFONATE SODIUM 8 MG/ML IJ SOLN
INTRAMUSCULAR | Status: DC | PRN
  Administered 2012-12-08: 5 mL via INTRAVENOUS

## 2012-12-08 MED ORDER — LACTATED RINGERS IV SOLN
INTRAVENOUS | Status: DC
  Administered 2012-12-08: 1000 mL via INTRAVENOUS

## 2012-12-08 MED ORDER — ONDANSETRON HCL 4 MG/2ML IJ SOLN
INTRAMUSCULAR | Status: DC | PRN
  Administered 2012-12-08: 4 mg via INTRAMUSCULAR

## 2012-12-08 MED ORDER — PROPOFOL 10 MG/ML IV BOLUS
INTRAVENOUS | Status: DC | PRN
  Administered 2012-12-08: 150 mg via INTRAVENOUS

## 2012-12-08 MED ORDER — DOCUSATE SODIUM 100 MG PO CAPS
100.0000 mg | ORAL_CAPSULE | Freq: Two times a day (BID) | ORAL | Status: DC
  Administered 2012-12-08 – 2012-12-09 (×2): 100 mg via ORAL
  Filled 2012-12-08 (×3): qty 1

## 2012-12-08 MED ORDER — ACETAMINOPHEN 325 MG PO TABS
650.0000 mg | ORAL_TABLET | ORAL | Status: DC | PRN
  Administered 2012-12-09: 650 mg via ORAL
  Filled 2012-12-08: qty 2

## 2012-12-08 MED ORDER — CEFAZOLIN SODIUM-DEXTROSE 2-3 GM-% IV SOLR
2.0000 g | INTRAVENOUS | Status: AC
  Administered 2012-12-08: 2 g via INTRAVENOUS

## 2012-12-08 MED ORDER — FENTANYL CITRATE 0.05 MG/ML IJ SOLN
INTRAMUSCULAR | Status: DC | PRN
  Administered 2012-12-08: 50 ug via INTRAVENOUS
  Administered 2012-12-08 (×2): 100 ug via INTRAVENOUS
  Administered 2012-12-08: 50 ug via INTRAVENOUS
  Administered 2012-12-08 (×2): 100 ug via INTRAVENOUS

## 2012-12-08 MED ORDER — KETOROLAC TROMETHAMINE 15 MG/ML IJ SOLN
INTRAMUSCULAR | Status: AC
  Filled 2012-12-08: qty 1

## 2012-12-08 MED ORDER — BUPIVACAINE-EPINEPHRINE PF 0.25-1:200000 % IJ SOLN
INTRAMUSCULAR | Status: AC
  Filled 2012-12-08: qty 30

## 2012-12-08 MED ORDER — HEPARIN SODIUM (PORCINE) 1000 UNIT/ML IJ SOLN
INTRAMUSCULAR | Status: AC
  Filled 2012-12-08: qty 1

## 2012-12-08 MED ORDER — LACTATED RINGERS IV SOLN
INTRAVENOUS | Status: DC | PRN
  Administered 2012-12-08: 12:00:00

## 2012-12-08 MED ORDER — CIPROFLOXACIN HCL 500 MG PO TABS: 500.0000 mg | ORAL_TABLET | Freq: Two times a day (BID) | ORAL | Status: DC

## 2012-12-08 MED ORDER — LACTATED RINGERS IR SOLN
Status: DC | PRN
  Administered 2012-12-08: 1000 mL

## 2012-12-08 MED ORDER — OXYCODONE HCL 5 MG PO TABS: 5.0000 mg | ORAL_TABLET | Freq: Once | ORAL | Status: DC | PRN

## 2012-12-08 MED ORDER — KCL IN DEXTROSE-NACL 20-5-0.45 MEQ/L-%-% IV SOLN
INTRAVENOUS | Status: DC
  Administered 2012-12-08 – 2012-12-09 (×3): via INTRAVENOUS
  Filled 2012-12-08 (×4): qty 1000

## 2012-12-08 MED ORDER — OXYCODONE HCL 5 MG/5ML PO SOLN
5.0000 mg | Freq: Once | ORAL | Status: DC | PRN
  Filled 2012-12-08: qty 5

## 2012-12-08 MED ORDER — CEFAZOLIN SODIUM 1-5 GM-% IV SOLN
1.0000 g | Freq: Three times a day (TID) | INTRAVENOUS | Status: AC
  Administered 2012-12-08 – 2012-12-09 (×2): 1 g via INTRAVENOUS
  Filled 2012-12-08 (×2): qty 50

## 2012-12-08 MED ORDER — KCL IN DEXTROSE-NACL 20-5-0.45 MEQ/L-%-% IV SOLN
INTRAVENOUS | Status: AC
  Filled 2012-12-08: qty 1000

## 2012-12-08 MED ORDER — SODIUM CHLORIDE 0.9 % IV BOLUS (SEPSIS)
1000.0000 mL | Freq: Once | INTRAVENOUS | Status: AC
  Administered 2012-12-08: 1000 mL via INTRAVENOUS

## 2012-12-08 MED ORDER — STERILE WATER FOR IRRIGATION IR SOLN
Status: DC | PRN
  Administered 2012-12-08: 3000 mL

## 2012-12-08 MED ORDER — ONDANSETRON HCL 4 MG/2ML IJ SOLN: 4.0000 mg | INTRAMUSCULAR | Status: DC | PRN

## 2012-12-08 MED ORDER — NEOSTIGMINE METHYLSULFATE 1 MG/ML IJ SOLN
INTRAMUSCULAR | Status: DC | PRN
  Administered 2012-12-08: 5 mg via INTRAVENOUS

## 2012-12-08 MED ORDER — HYDROMORPHONE HCL PF 1 MG/ML IJ SOLN
INTRAMUSCULAR | Status: AC
  Filled 2012-12-08: qty 1

## 2012-12-08 MED ORDER — DIPHENHYDRAMINE HCL 12.5 MG/5ML PO ELIX: 12.5000 mg | ORAL_SOLUTION | Freq: Four times a day (QID) | ORAL | Status: DC | PRN

## 2012-12-08 MED ORDER — KETOROLAC TROMETHAMINE 15 MG/ML IJ SOLN
15.0000 mg | Freq: Four times a day (QID) | INTRAMUSCULAR | Status: DC
  Administered 2012-12-08 – 2012-12-09 (×4): 15 mg via INTRAVENOUS
  Filled 2012-12-08 (×6): qty 1

## 2012-12-08 MED ORDER — CEFAZOLIN SODIUM-DEXTROSE 2-3 GM-% IV SOLR
INTRAVENOUS | Status: AC
  Filled 2012-12-08: qty 50

## 2012-12-08 MED ORDER — MORPHINE SULFATE 10 MG/ML IJ SOLN
2.0000 mg | INTRAMUSCULAR | Status: DC | PRN
  Administered 2012-12-08: 2 mg via INTRAVENOUS
  Administered 2012-12-08: 4 mg via INTRAVENOUS
  Administered 2012-12-08: 2 mg via INTRAVENOUS
  Filled 2012-12-08 (×3): qty 1

## 2012-12-08 MED ORDER — DIPHENHYDRAMINE HCL 50 MG/ML IJ SOLN: 12.5000 mg | Freq: Four times a day (QID) | INTRAMUSCULAR | Status: DC | PRN

## 2012-12-08 MED ORDER — PANTOPRAZOLE SODIUM 40 MG PO TBEC
40.0000 mg | DELAYED_RELEASE_TABLET | Freq: Every day | ORAL | Status: DC
  Administered 2012-12-09: 40 mg via ORAL
  Filled 2012-12-08 (×4): qty 1

## 2012-12-08 MED ORDER — ROCURONIUM BROMIDE 100 MG/10ML IV SOLN
INTRAVENOUS | Status: DC | PRN
  Administered 2012-12-08: 50 mg via INTRAVENOUS
  Administered 2012-12-08 (×2): 10 mg via INTRAVENOUS

## 2012-12-08 MED ORDER — HYDROMORPHONE HCL PF 1 MG/ML IJ SOLN
INTRAMUSCULAR | Status: DC | PRN
  Administered 2012-12-08: 1 mg via INTRAVENOUS
  Administered 2012-12-08: 0.5 mg via INTRAVENOUS

## 2012-12-08 MED ORDER — HYDROCODONE-ACETAMINOPHEN 5-325 MG PO TABS: 1.0000 | ORAL_TABLET | Freq: Four times a day (QID) | ORAL | Status: DC | PRN

## 2012-12-08 MED ORDER — SODIUM CHLORIDE 0.9 % IR SOLN
Status: DC | PRN
  Administered 2012-12-08: 1000 mL via INTRAVESICAL

## 2012-12-08 MED ORDER — BUPIVACAINE-EPINEPHRINE 0.25% -1:200000 IJ SOLN
INTRAMUSCULAR | Status: DC | PRN
  Administered 2012-12-08: 28 mL

## 2012-12-08 MED ORDER — MEPERIDINE HCL 50 MG/ML IJ SOLN: 6.2500 mg | INTRAMUSCULAR | Status: DC | PRN

## 2012-12-08 MED ORDER — TRAZODONE HCL 50 MG PO TABS
50.0000 mg | ORAL_TABLET | Freq: Every day | ORAL | Status: DC
  Administered 2012-12-08: 50 mg via ORAL
  Filled 2012-12-08 (×2): qty 1

## 2012-12-08 MED ORDER — PROMETHAZINE HCL 25 MG/ML IJ SOLN: 6.2500 mg | INTRAMUSCULAR | Status: DC | PRN

## 2012-12-08 MED ORDER — HYDROMORPHONE HCL PF 1 MG/ML IJ SOLN
0.2500 mg | INTRAMUSCULAR | Status: DC | PRN
  Administered 2012-12-08: 0.5 mg via INTRAVENOUS
  Administered 2012-12-08 (×2): 0.25 mg via INTRAVENOUS

## 2012-12-08 MED ORDER — GLYCOPYRROLATE 0.2 MG/ML IJ SOLN
INTRAMUSCULAR | Status: DC | PRN
  Administered 2012-12-08: 0.6 mg via INTRAVENOUS

## 2012-12-08 SURGICAL SUPPLY — 45 items
CANISTER SUCTION 2500CC (MISCELLANEOUS) ×2 IMPLANT
CATH FOLEY 2WAY SLVR 18FR 30CC (CATHETERS) ×2 IMPLANT
CATH ROBINSON RED A/P 16FR (CATHETERS) ×2 IMPLANT
CATH ROBINSON RED A/P 8FR (CATHETERS) ×2 IMPLANT
CATH TIEMANN FOLEY 18FR 5CC (CATHETERS) ×2 IMPLANT
CHLORAPREP W/TINT 26ML (MISCELLANEOUS) ×2 IMPLANT
CLIP LIGATING HEM O LOK PURPLE (MISCELLANEOUS) ×4 IMPLANT
CLOTH BEACON ORANGE TIMEOUT ST (SAFETY) ×2 IMPLANT
COVER SURGICAL LIGHT HANDLE (MISCELLANEOUS) ×2 IMPLANT
COVER TIP SHEARS 8 DVNC (MISCELLANEOUS) ×1 IMPLANT
COVER TIP SHEARS 8MM DA VINCI (MISCELLANEOUS) ×1
CUTTER ECHEON FLEX ENDO 45 340 (ENDOMECHANICALS) ×2 IMPLANT
DECANTER SPIKE VIAL GLASS SM (MISCELLANEOUS) ×2 IMPLANT
DERMABOND ADVANCED (GAUZE/BANDAGES/DRESSINGS)
DERMABOND ADVANCED .7 DNX12 (GAUZE/BANDAGES/DRESSINGS) IMPLANT
DRAPE SURG IRRIG POUCH 19X23 (DRAPES) ×2 IMPLANT
DRSG TEGADERM 4X4.75 (GAUZE/BANDAGES/DRESSINGS) ×2 IMPLANT
DRSG TEGADERM 6X8 (GAUZE/BANDAGES/DRESSINGS) ×4 IMPLANT
ELECT REM PT RETURN 9FT ADLT (ELECTROSURGICAL) ×2
ELECTRODE REM PT RTRN 9FT ADLT (ELECTROSURGICAL) ×1 IMPLANT
GLOVE BIO SURGEON STRL SZ 6.5 (GLOVE) ×2 IMPLANT
GLOVE BIOGEL M STRL SZ7.5 (GLOVE) ×4 IMPLANT
GOWN PREVENTION PLUS LG XLONG (DISPOSABLE) ×2 IMPLANT
GOWN STRL REIN XL XLG (GOWN DISPOSABLE) ×4 IMPLANT
HOLDER FOLEY CATH W/STRAP (MISCELLANEOUS) ×2 IMPLANT
IV LACTATED RINGERS 1000ML (IV SOLUTION) ×2 IMPLANT
KIT ACCESSORY DA VINCI DISP (KITS) ×1
KIT ACCESSORY DVNC DISP (KITS) ×1 IMPLANT
NDL SAFETY ECLIPSE 18X1.5 (NEEDLE) ×1 IMPLANT
NEEDLE HYPO 18GX1.5 SHARP (NEEDLE) ×1
PACK ROBOT UROLOGY CUSTOM (CUSTOM PROCEDURE TRAY) ×2 IMPLANT
RELOAD GREEN ECHELON 45 (STAPLE) ×2 IMPLANT
SET TUBE IRRIG SUCTION NO TIP (IRRIGATION / IRRIGATOR) ×2 IMPLANT
SOLUTION ELECTROLUBE (MISCELLANEOUS) ×2 IMPLANT
SPONGE GAUZE 4X4 12PLY (GAUZE/BANDAGES/DRESSINGS) ×2 IMPLANT
SUT ETHILON 3 0 PS 1 (SUTURE) ×2 IMPLANT
SUT MNCRL AB 4-0 PS2 18 (SUTURE) IMPLANT
SUT VIC AB 2-0 SH 27 (SUTURE) ×1
SUT VIC AB 2-0 SH 27X BRD (SUTURE) ×1 IMPLANT
SUT VIC AB 3-0 SH 27 (SUTURE) ×1
SUT VIC AB 3-0 SH 27X BRD (SUTURE) ×1 IMPLANT
SUT VICRYL 0 UR6 27IN ABS (SUTURE) ×4 IMPLANT
SYR 27GX1/2 1ML LL SAFETY (SYRINGE) ×2 IMPLANT
TOWEL OR NON WOVEN STRL DISP B (DISPOSABLE) ×2 IMPLANT
WATER STERILE IRR 1500ML POUR (IV SOLUTION) ×4 IMPLANT

## 2012-12-08 NOTE — Anesthesia Postprocedure Evaluation (Signed)
Anesthesia Post Note  Patient: Bradley Lambert  Procedure(s) Performed: Procedure(s) (LRB): ROBOTIC ASSISTED LAPAROSCOPIC RADICAL PROSTATECTOMY LEVEL 3 (N/A)  Anesthesia type: General  Patient location: PACU  Post pain: Pain level controlled  Post assessment: Post-op Vital signs reviewed  Last Vitals: BP 153/86  Pulse 71  Temp(Src) 36.4 C (Oral)  Resp 16  Ht 6\' 2"  (1.88 m)  Wt 228 lb (103.42 kg)  BMI 29.26 kg/m2  SpO2 100%  Post vital signs: Reviewed  Level of consciousness: sedated  Complications: No apparent anesthesia complications

## 2012-12-08 NOTE — Progress Notes (Signed)
Patient ID: Bradley Lambert, male   DOB: 03-Dec-1955, 57 y.o.   MRN: [id number]  Post-op note  Subjective: The patient is doing well.  No complaints.  Objective: Vital signs in last 24 hours: Temp:  [97.4 F (36.3 C)-98.4 F (36.9 C)] 98.3 F (36.8 C) (10/16 2000) Pulse Rate:  [61-83] 82 (10/16 2000) Resp:  [9-18] 18 (10/16 1821) BP: (108-153)/(78-94) 108/78 mmHg (10/16 2000) SpO2:  [96 %-100 %] 96 % (10/16 2000) Weight:  [103.42 kg (228 lb)] 103.42 kg (228 lb) (10/16 1500)  Intake/Output from previous day:   Intake/Output this shift:    Physical Exam:  General: Alert and oriented. Abdomen: Soft, Nondistended. Incisions: Clean and dry. GU: Urine clear  Lab Results:  Recent Labs  12/08/12 1414  HGB 13.6  HCT 38.5*    Assessment/Plan: POD#0   1) Continue to monitor   [name]. MD   LOS: 0 days   Levetta Bognar,LES 12/08/2012, 9:05 PM

## 2012-12-08 NOTE — Interval H&P Note (Signed)
History and Physical Interval Note:  12/08/2012 10:00 AM  Bradley Lambert  has presented today for surgery, with the diagnosis of PROSTATE CANCER  The various methods of treatment have been discussed with the patient and family. After consideration of risks, benefits and other options for treatment, the patient has consented to  Procedure(s): ROBOTIC ASSISTED LAPAROSCOPIC RADICAL PROSTATECTOMY LEVEL 3 (N/A) as a surgical intervention .  The patient's history has been reviewed, patient examined, no change in status, stable for surgery.  I have reviewed the patient's chart and labs.  Questions were answered to the patient's satisfaction.     Eddrick Dilone,LES

## 2012-12-08 NOTE — Anesthesia Preprocedure Evaluation (Signed)
Anesthesia Evaluation  Patient identified by MRN, date of birth, ID band Patient awake    Reviewed: Allergy & Precautions, H&P , NPO status , Patient's Chart, lab work & pertinent test results  Airway Mallampati: I TM Distance: >3 FB Neck ROM: Full    Dental  (+) Dental Advisory Given   Pulmonary neg pulmonary ROS,    Pulmonary exam normal       Cardiovascular hypertension, Pt. on medications     Neuro/Psych negative neurological ROS  negative psych ROS   GI/Hepatic Neg liver ROS, GERD-  Medicated and Controlled,  Endo/Other  negative endocrine ROS  Renal/GU negative Renal ROS     Musculoskeletal negative musculoskeletal ROS (+)   Abdominal   Peds  Hematology negative hematology ROS (+)   Anesthesia Other Findings   Reproductive/Obstetrics                           Anesthesia Physical  Anesthesia Plan  ASA: II  Anesthesia Plan: General   Post-op Pain Management:    Induction: Intravenous  Airway Management Planned: Oral ETT  Additional Equipment:   Intra-op Plan:   Post-operative Plan: Extubation in OR  Informed Consent: I have reviewed the patients History and Physical, chart, labs and discussed the procedure including the risks, benefits and alternatives for the proposed anesthesia with the patient or authorized representative who has indicated his/her understanding and acceptance.   Dental advisory given  Plan Discussed with: CRNA  Anesthesia Plan Comments:         Anesthesia Quick Evaluation

## 2012-12-08 NOTE — Transfer of Care (Signed)
Immediate Anesthesia Transfer of Care Note  Patient: Bradley Lambert  Procedure(s) Performed: Procedure(s): ROBOTIC ASSISTED LAPAROSCOPIC RADICAL PROSTATECTOMY LEVEL 3 (N/A)  Patient Location: PACU  Anesthesia Type:General  Level of Consciousness: awake and alert   Airway & Oxygen Therapy: Patient Spontanous Breathing and Patient connected to face mask oxygen  Post-op Assessment: Report given to PACU RN and Post -op Vital signs reviewed and stable  Post vital signs: Reviewed and stable  Complications: No apparent anesthesia complications

## 2012-12-08 NOTE — Op Note (Signed)
Preoperative diagnosis: Clinically localized adenocarcinoma of the prostate (clinical stage T1c Nx Mx)  Postoperative diagnosis: Clinically localized adenocarcinoma of the prostate (clinical stage T1c Nx Mx)  Procedure:  1. Robotic assisted laparoscopic radical prostatectomy (bilateral nerve sparing)  Surgeon: Rolly Salter, Montez Hageman. M.D.  Assistant: Pecola Leisure, PA-C  Anesthesia: General  Complications: None  EBL: 100 mL  IVF:  1000 mL crystalloid  Specimens: 1. Prostate and seminal vesicles  Disposition of specimens: Pathology  Drains: 1. 20 Fr coude catheter 2. # 19 Blake pelvic drain  Indication: Bradley Lambert is a 57 y.o. year old patient with clinically localized prostate cancer.  After a thorough review of the management options for treatment of prostate cancer, he elected to proceed with surgical therapy and the above procedure(s).  We have discussed the potential benefits and risks of the procedure, side effects of the proposed treatment, the likelihood of the patient achieving the goals of the procedure, and any potential problems that might occur during the procedure or recuperation. Informed consent has been obtained.  Description of procedure:  The patient was taken to the operating room and a general anesthetic was administered. He was given preoperative antibiotics, placed in the dorsal lithotomy position, and prepped and draped in the usual sterile fashion. Next a preoperative timeout was performed. A urethral catheter was placed into the bladder and a site was selected near the umbilicus for placement of the camera port. This was placed using a standard open Hassan technique which allowed entry into the peritoneal cavity under direct vision and without difficulty. A 12 mm port was placed and a pneumoperitoneum established. The camera was then used to inspect the abdomen and there was no evidence of any intra-abdominal injuries or other abnormalities. The  remaining abdominal ports were then placed. 8 mm robotic ports were placed in the right lower quadrant, left lower quadrant, and far left lateral abdominal wall. A 5 mm port was placed in the right upper quadrant and a 12 mm port was placed in the right lateral abdominal wall for laparoscopic assistance. All ports were placed under direct vision without difficulty. The surgical cart was then docked.   Utilizing the cautery scissors, the bladder was reflected posteriorly allowing entry into the space of Retzius and identification of the endopelvic fascia and prostate. Left sided inguinal mesh was identified from his prior laparoscopic hernia repair.  It was noted that mesh extended medially onto the bladder without attachment to the abdominal wall.  This was dissected off the bladder which left a small flap of mesh.  I spoke with Dr. Michaell Cowing in General Surgery and he recommended tacking this up to the abdominal wall so as to avoid a flap of mesh hanging into the pelvis.  A 2-0 vicryl was used to secure the mesh to the underside of the rectus abdominis fascia. The periprostatic fat was then removed from the prostate allowing full exposure of the endopelvic fascia. The endopelvic fascia was then incised from the apex back to the base of the prostate bilaterally and the underlying levator muscle fibers were swept laterally off the prostate thereby isolating the dorsal venous complex. The dorsal vein was then stapled and divided with a 45 mm Flex Echelon stapler. Attention then turned to the bladder neck which was divided anteriorly thereby allowing entry into the bladder and exposure of the urethral catheter. There was a very large median lobe noted. The catheter balloon was deflated and the catheter was brought into the operative field and  used to retract the prostate anteriorly. The median lobe was retracted anteriorly and the posterior bladder neck was then examined and was divided allowing further dissection between  the bladder and prostate posteriorly until the vasa deferentia and seminal vessels were identified. The vasa deferentia were isolated, divided, and lifted anteriorly. The seminal vesicles were dissected down to their tips with care to control the seminal vascular arterial blood supply. These structures were then lifted anteriorly and the space between Denonvillier's fascia and the anterior rectum was developed with a combination of sharp and blunt dissection. This isolated the vascular pedicles of the prostate.  The lateral prostatic fascia was then sharply incised allowing release of the neurovascular bundles bilaterally. The vascular pedicles of the prostate were then ligated with Weck clips between the prostate and neurovascular bundles and divided with sharp cold scissor dissection resulting in neurovascular bundle preservation. The neurovascular bundles were then separated off the apex of the prostate and urethra bilaterally.  The urethra was then sharply transected allowing the prostate specimen to be disarticulated. The pelvis was copiously irrigated and hemostasis was ensured. There was no evidence for rectal injury.  Attention then turned to the urethral anastomosis. A 2-0 Vicryl slip knot was placed between Denonvillier's fascia, the posterior bladder neck, and the posterior urethra to reapproximate these structures. A double-armed 3-0 Monocryl suture was then used to perform a 360 running tension-free anastomosis between the bladder neck and urethra. A new urethral catheter was then placed into the bladder and irrigated. There were no blood clots within the bladder and the anastomosis appeared to be watertight. A #19 Blake drain was then brought through the left lateral 8 mm port site and positioned appropriately within the pelvis. It was secured to the skin with a nylon suture. The surgical cart was then undocked. The right lateral 12 mm port site was closed at the fascial level with a 0 Vicryl  suture placed laparoscopically. All remaining ports were then removed under direct vision. The prostate specimen was removed intact within the Endopouch retrieval bag via the periumbilical camera port site. This fascial opening was closed with two running 0 Vicryl sutures. 0.25% Marcaine was then injected into all port sites and all incisions were reapproximated at the skin level with staples. Sterile dressings were applied. The patient appeared to tolerate the procedure well and without complications. The patient was able to be extubated and transferred to the recovery unit in satisfactory condition.  [name] MD

## 2012-12-09 ENCOUNTER — Encounter (HOSPITAL_COMMUNITY): Payer: Self-pay | Admitting: Urology

## 2012-12-09 LAB — HEMOGLOBIN AND HEMATOCRIT, BLOOD
HCT: 35.1 % — ABNORMAL LOW (ref 39.0–52.0)
Hemoglobin: 12.3 g/dL — ABNORMAL LOW (ref 13.0–17.0)

## 2012-12-09 MED ORDER — HYDROCODONE-ACETAMINOPHEN 5-325 MG PO TABS: 1.0000 | ORAL_TABLET | Freq: Four times a day (QID) | ORAL | Status: DC | PRN

## 2012-12-09 MED ORDER — BISACODYL 10 MG RE SUPP
10.0000 mg | Freq: Once | RECTAL | Status: AC
  Administered 2012-12-09: 10 mg via RECTAL
  Filled 2012-12-09: qty 1

## 2012-12-09 NOTE — Progress Notes (Signed)
Patient ID: Bradley Lambert, male   DOB: [birth year], 57 y.o.   MRN: [id number] 1 Day Post-Op Subjective: The patient is doing well.  No nausea or vomiting. Pain is adequately controlled.  Objective: Vital signs in last 24 hours: Temp:  [97.4 F (36.3 C)-98.4 F (36.9 C)] 97.9 F (36.6 C) (10/17 0605) Pulse Rate:  [61-83] 65 (10/17 0605) Resp:  [9-20] 20 (10/17 0605) BP: (107-153)/(67-94) 107/69 mmHg (10/17 0605) SpO2:  [96 %-100 %] 97 % (10/17 0605) Weight:  [103.42 kg (228 lb)] 103.42 kg (228 lb) (10/16 1500)  Intake/Output from previous day: 10/16 0701 - 10/17 0700 In: 3037.5 [P.O.:360; I.V.:1497.5; IV Piggyback:1100] Out: 1225 [Urine:1150; Drains:75] Intake/Output this shift:    Physical Exam:  General: Alert and oriented. CV: RRR Lungs: Clear bilaterally. GI: Soft, Nondistended. Incisions: Dressings intact. Urine: Clear Extremities: Nontender, no erythema, no edema.  Lab Results:  Recent Labs  12/08/12 1414 12/09/12 0420  HGB 13.6 12.3*  HCT 38.5* 35.1*      Assessment/Plan: POD# 1 s/p robotic prostatectomy.  1) SL IVF 2) Ambulate, Incentive spirometry 3) Transition to oral pain medication 4) Dulcolax suppository 5) D/C pelvic drain 6) Plan for likely discharge later today   [name]. MD   LOS: 1 day   [name],LES 12/09/2012, 7:08 AM

## 2012-12-09 NOTE — Discharge Summary (Signed)
  Date of admission: 12/08/2012  Date of discharge: 12/09/2012  Admission diagnosis: Prostate Cancer  Discharge diagnosis: Prostate Cancer  History and Physical: For full details, please see admission history and physical. Briefly, Bradley Lambert is a 56 y.o. gentleman with localized prostate cancer.  After discussing management/treatment options, he elected to proceed with surgical treatment.  Hospital Course: Bradley Lambert was taken to the operating room on 12/08/2012 and underwent a robotic assisted laparoscopic radical prostatectomy. He tolerated this procedure well and without complications. Postoperatively, he was able to be transferred to a regular hospital room following recovery from anesthesia.  He was able to begin ambulating the night of surgery. He remained hemodynamically stable overnight.  He had excellent urine output with appropriately minimal output from his pelvic drain and his pelvic drain was removed on POD #1.  He was transitioned to oral pain medication, tolerated a clear liquid diet, and had met all discharge criteria and was able to be discharged home later on POD#1.  Laboratory values:  Recent Labs  12/08/12 1414 12/09/12 0420  HGB 13.6 12.3*  HCT 38.5* 35.1*    Disposition: Home  Discharge instruction: He was instructed to be ambulatory but to refrain from heavy lifting, strenuous activity, or driving. He was instructed on urethral catheter care.  Discharge medications:     Medication List    STOP taking these medications       ibuprofen 200 MG tablet  Commonly known as:  ADVIL,MOTRIN      TAKE these medications       ciprofloxacin 500 MG tablet  Commonly known as:  CIPRO  Take 1 tablet (500 mg total) by mouth 2 (two) times daily. Start day prior to office visit for foley removal     HYDROcodone-acetaminophen 5-325 MG per tablet  Commonly known as:  NORCO  Take 1-2 tablets by mouth every 6 (six) hours as needed for pain.     losartan 100 MG  tablet  Commonly known as:  COZAAR  Take 100 mg by mouth every morning.     omeprazole 20 MG capsule  Commonly known as:  PRILOSEC  Take 20 mg by mouth 2 (two) times a week.     traZODone 50 MG tablet  Commonly known as:  DESYREL  Take 50 mg by mouth at bedtime.        Followup: He will followup in 1 week for catheter removal and to discuss his surgical pathology results.

## 2012-12-09 NOTE — Progress Notes (Signed)
CARE MANAGEMENT NOTE 12/09/2012  Patient:  ROBERTH, BERLING   Account Number:  [id number]  Date Initiated:  12/09/2012  Documentation initiated by:  Delania Ferg  Subjective/Objective Assessment:   prostatectomy post op course     Action/Plan:   home when stable   Anticipated DC Date:  12/10/2012   Anticipated DC Plan:  HOME/SELF CARE  In-house referral  NA      DC Planning Services  NA      [facility] Choice  NA   Choice offered to / List presented to:  NA   DME arranged  NA      DME agency  NA     HH arranged  NA      HH agency  NA   Status of service:  In process, will continue to follow Medicare Important Message given?  NA - LOS <3 / Initial given by admissions (If response is "NO", the following Medicare IM given date fields will be blank) Date Medicare IM given:   Date Additional Medicare IM given:    Discharge Disposition:    Per UR Regulation:  Reviewed for med. necessity/level of care/duration of stay  If discussed at Long Length of Stay Meetings, dates discussed:    Comments:  [id number]/Gradie Butrick Stark Jock, BSN, [place] [phone] Chart Reviewed for discharge and hospital needs. Discharge needs at time of review:  None Review of patient progress due on [id number].

## 2012-12-29 ENCOUNTER — Other Ambulatory Visit: Payer: Self-pay

## 2013-04-14 ENCOUNTER — Other Ambulatory Visit: Payer: Self-pay | Admitting: Orthopedic Surgery

## 2013-04-14 ENCOUNTER — Encounter (HOSPITAL_BASED_OUTPATIENT_CLINIC_OR_DEPARTMENT_OTHER): Payer: Self-pay | Admitting: *Deleted

## 2013-04-14 NOTE — Progress Notes (Signed)
Will need istat-wife will be with daughter being induced to deliver-will come get him.

## 2013-04-16 NOTE — H&P (Signed)
Bradley Lambert is an 58 y.o. male.   CC / Reason for Visit: Hand mass HPI: This patient returns for reevaluation, noting that the index finger retinacular cyst that I aspirated in February 2014 has recurred and is symptomatic again.  He would like to have it removed permanently with surgery.    Presenting history follows: This patient is a 58 year old male who presents for evaluation of a mass on the palmar aspect of the right hand over the index MCP joint.  He first noticed it about 3 months ago.  It has become gradually more comfortable holding onto the steering wheel and other objects to press into the palm.  He reports having had a couple of similar thing was removed in the past, but has had no treatment for this yet.  His other problems were never treated with aspiration.  Past Medical History  Diagnosis Date  . GERD (gastroesophageal reflux disease)   . Insomnia   . Hypertension   . Arthritis     back-arthritis  . Prostate cancer 12-01-12    bx.x2- last- 6'14 -dx. prostate cancer  . Wears glasses     Past Surgical History  Procedure Laterality Date  . Tonsillectomy    . Dermoid cyst  excision      x3 hands  . Finger ganglion cyst excision    . Colonoscopy    . Eye surgery      radiocaratotomyx4  . Parotidectomy  08/14/2011    Procedure: PAROTIDECTOMY;  Surgeon: Rozetta Nunnery, MD;  Location: [facility];  Service: ENT;  Laterality: Left;  EXCISION PAROTID MASS WITH FACIAL NERVE DISSECTION  . Hernia repair      hernia x3  . Melanoma excision  12-01-12    mid.anterior chest -melanoma excised -11 yrs ago  . Cyst excision      forehead- sebaceous  . Robot assisted laparoscopic radical prostatectomy N/A 12/08/2012    Procedure: ROBOTIC ASSISTED LAPAROSCOPIC RADICAL PROSTATECTOMY LEVEL 3;  Surgeon: Dutch Gray, MD;  Location: [facility];  Service: Urology;  Laterality: N/A;    History reviewed. No pertinent family history. Social History:  reports that he has  never smoked. He does not have any smokeless tobacco history on file. He reports that he drinks alcohol. He reports that he does not use illicit drugs.  Allergies: No Known Allergies  No prescriptions prior to admission    No results found for this or any previous visit (from the past 48 hour(s)). No results found.  Review of Systems  All other systems reviewed and are negative.    Height 6\' 2"  (1.88 m), weight 102.967 kg (227 lb). Physical Exam  Constitutional:  WD, WN, NAD HEENT:  NCAT, EOMI Neuro/Psych:  Alert & oriented to person, place, and time; appropriate mood & affect Lymphatic: No generalized UE edema or lymphadenopathy Extremities / MSK:  Both UE are normal with respect to appearance, ranges of motion, joint stability, muscle strength/tone, sensation, & perfusion except as otherwise noted:  There is a small mass on the palmar surface of the right hand over the MCP joint consistent in location and character with a retinacular cyst, in diameter between one half and 1 cm.  Labs / Xrays: No radiographic studies obtained today.  Assessment:  Right index finger retinacular cyst  Plan:  I discussed this entity with the patient.  We will plan to proceed next week.    The details of the operative procedure were discussed with the patient.  Questions  were invited and answered.  In addition to the goal of the procedure, the risks of the procedure to include but not limited to bleeding; infection; damage to the nerves or blood vessels that could result in bleeding, numbness, weakness, chronic pain, and the need for additional procedures; stiffness; the need for revision surgery; and anesthetic risks, the worst of which is death, were reviewed.  No specific outcome was guaranteed or implied.  Informed consent was obtained.  Prescriptions for postoperative analgesia were also written.  A return appointment 10-15 days postop was made.  Toniqua Melamed A. 04/16/2013, 2:57 PM

## 2013-04-17 ENCOUNTER — Encounter (HOSPITAL_BASED_OUTPATIENT_CLINIC_OR_DEPARTMENT_OTHER): Payer: Self-pay | Admitting: *Deleted

## 2013-04-17 ENCOUNTER — Encounter (HOSPITAL_BASED_OUTPATIENT_CLINIC_OR_DEPARTMENT_OTHER): Payer: PRIVATE HEALTH INSURANCE | Admitting: Anesthesiology

## 2013-04-17 ENCOUNTER — Ambulatory Visit (HOSPITAL_BASED_OUTPATIENT_CLINIC_OR_DEPARTMENT_OTHER)
Admission: RE | Admit: 2013-04-17 | Discharge: 2013-04-17 | Disposition: A | Discharge status: Home / Self Care | Payer: PRIVATE HEALTH INSURANCE | Source: Ambulatory Visit | Attending: Orthopedic Surgery | Admitting: Orthopedic Surgery

## 2013-04-17 ENCOUNTER — Encounter (HOSPITAL_BASED_OUTPATIENT_CLINIC_OR_DEPARTMENT_OTHER)
Admission: RE | Disposition: A | Discharge status: Home / Self Care | Payer: Self-pay | Source: Ambulatory Visit | Attending: Orthopedic Surgery

## 2013-04-17 ENCOUNTER — Ambulatory Visit (HOSPITAL_BASED_OUTPATIENT_CLINIC_OR_DEPARTMENT_OTHER): Payer: PRIVATE HEALTH INSURANCE | Admitting: Anesthesiology

## 2013-04-17 DIAGNOSIS — I10 Essential (primary) hypertension: Secondary | ICD-10-CM | POA: Insufficient documentation

## 2013-04-17 DIAGNOSIS — M6740 Ganglion, unspecified site: Secondary | ICD-10-CM | POA: Insufficient documentation

## 2013-04-17 DIAGNOSIS — K219 Gastro-esophageal reflux disease without esophagitis: Secondary | ICD-10-CM | POA: Insufficient documentation

## 2013-04-17 DIAGNOSIS — M65849 Other synovitis and tenosynovitis, unspecified hand: Secondary | ICD-10-CM

## 2013-04-17 DIAGNOSIS — M65839 Other synovitis and tenosynovitis, unspecified forearm: Secondary | ICD-10-CM | POA: Insufficient documentation

## 2013-04-17 DIAGNOSIS — G4700 Insomnia, unspecified: Secondary | ICD-10-CM | POA: Insufficient documentation

## 2013-04-17 DIAGNOSIS — Z8546 Personal history of malignant neoplasm of prostate: Secondary | ICD-10-CM | POA: Insufficient documentation

## 2013-04-17 HISTORY — DX: Presence of spectacles and contact lenses: Z97.3

## 2013-04-17 HISTORY — PX: MASS EXCISION: SHX2000

## 2013-04-17 LAB — POCT HEMOGLOBIN-HEMACUE: HEMOGLOBIN: 12.7 g/dL — AB (ref 13.0–17.0)

## 2013-04-17 SURGERY — EXCISION MASS
Anesthesia: Monitor Anesthesia Care | Site: Hand | Laterality: Right

## 2013-04-17 MED ORDER — LACTATED RINGERS IV SOLN
INTRAVENOUS | Status: DC
  Administered 2013-04-17: 10:00:00 via INTRAVENOUS

## 2013-04-17 MED ORDER — CEFAZOLIN SODIUM-DEXTROSE 2-3 GM-% IV SOLR
INTRAVENOUS | Status: AC
  Filled 2013-04-17: qty 50

## 2013-04-17 MED ORDER — MIDAZOLAM HCL 5 MG/5ML IJ SOLN: INTRAMUSCULAR | Status: DC | PRN

## 2013-04-17 MED ORDER — CHLORHEXIDINE GLUCONATE 4 % EX LIQD: 60.0000 mL | Freq: Once | CUTANEOUS | Status: DC

## 2013-04-17 MED ORDER — OXYCODONE HCL 5 MG PO TABS: 5.0000 mg | ORAL_TABLET | Freq: Once | ORAL | Status: DC | PRN

## 2013-04-17 MED ORDER — MIDAZOLAM HCL 2 MG/2ML IJ SOLN: 1.0000 mg | INTRAMUSCULAR | Status: DC | PRN

## 2013-04-17 MED ORDER — OXYCODONE HCL 5 MG/5ML PO SOLN: 5.0000 mg | Freq: Once | ORAL | Status: DC | PRN

## 2013-04-17 MED ORDER — PROMETHAZINE HCL 25 MG/ML IJ SOLN: 6.2500 mg | INTRAMUSCULAR | Status: DC | PRN

## 2013-04-17 MED ORDER — HYDROCODONE-ACETAMINOPHEN 5-325 MG PO TABS: 1.0000 | ORAL_TABLET | ORAL | Status: DC | PRN

## 2013-04-17 MED ORDER — FENTANYL CITRATE 0.05 MG/ML IJ SOLN
INTRAMUSCULAR | Status: DC | PRN
  Administered 2013-04-17 (×2): 50 ug via INTRAVENOUS

## 2013-04-17 MED ORDER — LIDOCAINE HCL (CARDIAC) 20 MG/ML IV SOLN
INTRAVENOUS | Status: DC | PRN
  Administered 2013-04-17: 50 mg via INTRAVENOUS

## 2013-04-17 MED ORDER — ONDANSETRON HCL 4 MG/2ML IJ SOLN
INTRAMUSCULAR | Status: DC | PRN
  Administered 2013-04-17: 4 mg via INTRAVENOUS

## 2013-04-17 MED ORDER — PROPOFOL INFUSION 10 MG/ML OPTIME
INTRAVENOUS | Status: DC | PRN
  Administered 2013-04-17: 75 ug/kg/min via INTRAVENOUS

## 2013-04-17 MED ORDER — 0.9 % SODIUM CHLORIDE (POUR BTL) OPTIME
TOPICAL | Status: DC | PRN
  Administered 2013-04-17: 100 mL

## 2013-04-17 MED ORDER — HYDROMORPHONE HCL PF 1 MG/ML IJ SOLN: 0.2500 mg | INTRAMUSCULAR | Status: DC | PRN

## 2013-04-17 MED ORDER — FENTANYL CITRATE 0.05 MG/ML IJ SOLN: 50.0000 ug | INTRAMUSCULAR | Status: DC | PRN

## 2013-04-17 MED ORDER — MIDAZOLAM HCL 5 MG/5ML IJ SOLN
INTRAMUSCULAR | Status: DC | PRN
  Administered 2013-04-17: 1 mg via INTRAVENOUS

## 2013-04-17 MED ORDER — CEFAZOLIN SODIUM-DEXTROSE 2-3 GM-% IV SOLR
2.0000 g | INTRAVENOUS | Status: AC
  Administered 2013-04-17: 2 g via INTRAVENOUS

## 2013-04-17 MED ORDER — MIDAZOLAM HCL 2 MG/2ML IJ SOLN
INTRAMUSCULAR | Status: AC
  Filled 2013-04-17: qty 2

## 2013-04-17 MED ORDER — PROPOFOL 10 MG/ML IV BOLUS
INTRAVENOUS | Status: AC
  Filled 2013-04-17: qty 20

## 2013-04-17 MED ORDER — LIDOCAINE HCL 1 % IJ SOLN
INTRAMUSCULAR | Status: DC | PRN
  Administered 2013-04-17: 10:00:00

## 2013-04-17 MED ORDER — FENTANYL CITRATE 0.05 MG/ML IJ SOLN
INTRAMUSCULAR | Status: AC
  Filled 2013-04-17: qty 4

## 2013-04-17 MED ORDER — HYDROMORPHONE HCL PF 1 MG/ML IJ SOLN: 0.5000 mg | INTRAMUSCULAR | Status: DC | PRN

## 2013-04-17 MED ORDER — LACTATED RINGERS IV SOLN
INTRAVENOUS | Status: DC
  Administered 2013-04-17: 09:00:00 via INTRAVENOUS

## 2013-04-17 MED ORDER — OXYCODONE-ACETAMINOPHEN 5-325 MG PO TABS
1.0000 | ORAL_TABLET | ORAL | Status: DC | PRN
Start: 2013-04-17 — End: 2013-04-17

## 2013-04-17 SURGICAL SUPPLY — 44 items
BANDAGE COBAN STERILE 2 (GAUZE/BANDAGES/DRESSINGS) IMPLANT
BANDAGE GAUZE STRT 1 STR LF (GAUZE/BANDAGES/DRESSINGS) IMPLANT
BLADE MINI RND TIP GREEN BEAV (BLADE) IMPLANT
BLADE SURG 15 STRL LF DISP TIS (BLADE) ×1 IMPLANT
BLADE SURG 15 STRL SS (BLADE) ×1
BNDG COHESIVE 1X5 TAN STRL LF (GAUZE/BANDAGES/DRESSINGS) ×2 IMPLANT
BNDG COHESIVE 4X5 TAN STRL (GAUZE/BANDAGES/DRESSINGS) IMPLANT
BNDG CONFORM 2 STRL LF (GAUZE/BANDAGES/DRESSINGS) ×2 IMPLANT
BNDG ESMARK 4X9 LF (GAUZE/BANDAGES/DRESSINGS) ×2 IMPLANT
BNDG GAUZE ELAST 4 BULKY (GAUZE/BANDAGES/DRESSINGS) IMPLANT
CHLORAPREP W/TINT 26ML (MISCELLANEOUS) ×2 IMPLANT
CORDS BIPOLAR (ELECTRODE) IMPLANT
COVER MAYO STAND STRL (DRAPES) ×2 IMPLANT
COVER TABLE BACK 60X90 (DRAPES) ×2 IMPLANT
CUFF TOURNIQUET SINGLE 18IN (TOURNIQUET CUFF) ×2 IMPLANT
DRAIN PENROSE 1/2X12 LTX STRL (WOUND CARE) IMPLANT
DRAPE EXTREMITY T 121X128X90 (DRAPE) ×2 IMPLANT
DRAPE SURG 17X23 STRL (DRAPES) ×2 IMPLANT
DRSG EMULSION OIL 3X3 NADH (GAUZE/BANDAGES/DRESSINGS) ×2 IMPLANT
GLOVE BIO SURGEON STRL SZ7.5 (GLOVE) ×2 IMPLANT
GLOVE BIOGEL PI IND STRL 7.0 (GLOVE) ×1 IMPLANT
GLOVE BIOGEL PI IND STRL 8 (GLOVE) ×1 IMPLANT
GLOVE BIOGEL PI INDICATOR 7.0 (GLOVE) ×1
GLOVE BIOGEL PI INDICATOR 8 (GLOVE) ×1
GLOVE ECLIPSE 6.5 STRL STRAW (GLOVE) ×2 IMPLANT
GLOVE EXAM NITRILE MD LF STRL (GLOVE) ×2 IMPLANT
GOWN STRL REUS W/ TWL LRG LVL3 (GOWN DISPOSABLE) ×1 IMPLANT
GOWN STRL REUS W/TWL LRG LVL3 (GOWN DISPOSABLE) ×1
NEEDLE HYPO 25X1 1.5 SAFETY (NEEDLE) ×2 IMPLANT
NS IRRIG 1000ML POUR BTL (IV SOLUTION) ×2 IMPLANT
PACK BASIN DAY SURGERY FS (CUSTOM PROCEDURE TRAY) ×2 IMPLANT
PAD CAST 4YDX4 CTTN HI CHSV (CAST SUPPLIES) IMPLANT
PADDING CAST ABS 4INX4YD NS (CAST SUPPLIES)
PADDING CAST ABS COTTON 4X4 ST (CAST SUPPLIES) IMPLANT
PADDING CAST COTTON 4X4 STRL (CAST SUPPLIES)
RUBBERBAND STERILE (MISCELLANEOUS) IMPLANT
SPONGE GAUZE 4X4 12PLY (GAUZE/BANDAGES/DRESSINGS) ×2 IMPLANT
STOCKINETTE 4X48 STRL (DRAPES) ×2 IMPLANT
SUT VICRYL RAPIDE 4/0 PS 2 (SUTURE) ×2 IMPLANT
SYR BULB 3OZ (MISCELLANEOUS) ×2 IMPLANT
SYRINGE 10CC LL (SYRINGE) ×2 IMPLANT
TOWEL OR 17X24 6PK STRL BLUE (TOWEL DISPOSABLE) ×2 IMPLANT
TOWEL OR NON WOVEN STRL DISP B (DISPOSABLE) ×2 IMPLANT
UNDERPAD 30X30 INCONTINENT (UNDERPADS AND DIAPERS) ×2 IMPLANT

## 2013-04-17 NOTE — Discharge Instructions (Signed)
Discharge Instructions ° ° °You have a light dressing on your hand.  °You may begin gentle motion of your fingers and hand immediately, but you should not do any heavy lifting or gripping.  Elevate your hand to reduce pain & swelling of the digits.  Ice over the operative site may be helpful to reduce pain & swelling.  DO NOT USE HEAT. °Pain medicine has been prescribed for you.  °Use your medicine as needed over the first 48 hours, and then you can begin to taper your use. You may use Tylenol in place of your prescribed pain medication, but not IN ADDITION to it. °Leave the dressing in place until the third day after your surgery and then remove it, leaving it open to air.  °After the bandage has been removed you may shower, but do not soak the incision.  °You may drive a car when you are off of prescription pain medications and can safely control your vehicle with both hands. °We will address whether therapy will be required or not when you return to the office. °You may have already made your follow-up appointment when we completed your preop visit.  If not, please call our office today or the next business day to make your return appointment for 10-15 days after surgery. ° ° °Please call [phone] during normal business hours or [phone] after hours for any problems. Including the following: ° °- excessive redness of the incisions °- drainage for more than 4 days °- fever of more than 101.5 F ° °*Please note that pain medications will not be refilled after hours or on weekends. ° °Post Anesthesia Home Care Instructions ° °Activity: °Get plenty of rest for the remainder of the day. A responsible adult should stay with you for 24 hours following the procedure.  °For the next 24 hours, DO NOT: °-Drive a car °-Operate machinery °-Drink alcoholic beverages °-Take any medication unless instructed by your physician °-Make any legal decisions or sign important papers. ° °Meals: °Start with liquid foods such as  gelatin or soup. Progress to regular foods as tolerated. Avoid greasy, spicy, heavy foods. If nausea and/or vomiting occur, drink only clear liquids until the nausea and/or vomiting subsides. Call your physician if vomiting continues. ° °Special Instructions/Symptoms: °Your throat may feel dry or sore from the anesthesia or the breathing tube placed in your throat during surgery. If this causes discomfort, gargle with warm salt water. The discomfort should disappear within 24 hours. ° °

## 2013-04-17 NOTE — Interval H&P Note (Signed)
History and Physical Interval Note:  04/17/2013 9:52 AM  Bradley Lambert  has presented today for surgery, with the diagnosis of Right Index Finger Cyst  The various methods of treatment have been discussed with the patient and family. After consideration of risks, benefits and other options for treatment, the patient has consented to  Procedure(s): RIGHT INDEX FINGER CYST REMOVAL (Right) as a surgical intervention .  The patient's history has been reviewed, patient examined, no change in status, stable for surgery.  I have reviewed the patient's chart and labs.  Questions were answered to the patient's satisfaction.     [name] A.

## 2013-04-17 NOTE — Transfer of Care (Signed)
Immediate Anesthesia Transfer of Care Note  Patient: Bradley Lambert  Procedure(s) Performed: Procedure(s): RIGHT INDEX FINGER CYST REMOVAL (Right)  Patient Location: PACU  Anesthesia Type:MAC  Level of Consciousness: awake, alert  and oriented  Airway & Oxygen Therapy: Patient Spontanous Breathing and Patient connected to face mask oxygen  Post-op Assessment: Report given to PACU RN and Post -op Vital signs reviewed and stable  Post vital signs: Reviewed and stable  Complications: No apparent anesthesia complications

## 2013-04-17 NOTE — Op Note (Signed)
04/17/2013  10:02 AM  PATIENT:  Bradley Lambert  58 y.o. 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 Grandville Silos, MD  PHYSICIAN ASSISTANT: None  ANESTHESIA:  local and MAC  SPECIMENS:  None  DRAINS:   None  PREOPERATIVE INDICATIONS:  [name] is a  58 y.o. male with history of right index finger retinacular cyst with associated flexor tenosynovitis. It has responded only transiently to nonoperative management.  The risks benefits and alternatives were discussed with the patient preoperatively including but not limited to the risks of infection, bleeding, nerve injury, cardiopulmonary complications, the need for revision surgery, among others, and the patient verbalized understanding and consented to proceed.  OPERATIVE IMPLANTS: None  OPERATIVE PROCEDURE:  After receiving prophylactic antibiotics, the patient was escorted to the operative theatre and placed in a supine position. The planned incision was marked and anesthetized with a mixture of lidocaine and Marcaine bearing epinephrine. A surgical "time-out" was performed during which the planned procedure, proposed operative site, and the correct patient identity were compared to the operative consent and agreement confirmed by the circulating nurse according to current facility policy.  Following application of a tourniquet to the operative extremity, the exposed skin was prepped with Chloraprep and draped in the usual sterile fashion.  The limb was exsanguinated with an Esmarch bandage and the tourniquet inflated to approximately 138mmHg higher than systolic BP.  An oblique incision was made over the A1 pulley, splitting the difference between his proximal and distal palmar creases. The skin flaps  were retracted. The underlying radial digital nerve was identified and retracted radially. The lesion of the tendon sheath was excised off of the tendon sheath first. The A1 pulley wasn't split longitudinally down its midline and some other crossing bands over the tendons more proximally were split as well. The tendons were pulled into view, the thickened tenosynovium debrided from them and they were returned to her bed. The wound is copiously irrigated as the tourniquet was released. Some additional hemostasis wasn't necessary and the skin was closed with 4-0 Vicryl Rapide interrupted sutures. A light dressing was applied and he was taken to recovery in stable condition  DISPOSITION: He'll be discharged home today returning in 10-15 days for reevaluation.

## 2013-04-17 NOTE — Anesthesia Preprocedure Evaluation (Signed)
Anesthesia Evaluation  Patient identified by MRN, date of birth, ID band Patient awake    Reviewed: Allergy & Precautions, H&P , NPO status , Patient's Chart, lab work & pertinent test results  Airway Mallampati: I TM Distance: >3 FB Neck ROM: full    Dental  (+) Teeth Intact, Dental Advidsory Given   Pulmonary neg pulmonary ROS,  breath sounds clear to auscultation        Cardiovascular hypertension, On Medications Rhythm:regular Rate:Normal     Neuro/Psych negative neurological ROS  negative psych ROS   GI/Hepatic Neg liver ROS, Medicated,  Endo/Other  negative endocrine ROS  Renal/GU negative Renal ROS     Musculoskeletal   Abdominal   Peds  Hematology   Anesthesia Other Findings   Reproductive/Obstetrics negative OB ROS                           Anesthesia Physical Anesthesia Plan  ASA: II  Anesthesia Plan: MAC   Post-op Pain Management:    Induction:   Airway Management Planned:   Additional Equipment:   Intra-op Plan:   Post-operative Plan:   Informed Consent: I have reviewed the patients History and Physical, chart, labs and discussed the procedure including the risks, benefits and alternatives for the proposed anesthesia with the patient or authorized representative who has indicated his/her understanding and acceptance.   Dental Advisory Given  Plan Discussed with: Anesthesiologist, CRNA and Surgeon  Anesthesia Plan Comments:         Anesthesia Quick Evaluation

## 2013-04-17 NOTE — Anesthesia Postprocedure Evaluation (Signed)
Anesthesia Post Note  Patient: Bradley Lambert  Procedure(s) Performed: Procedure(s) (LRB): RIGHT INDEX FINGER CYST REMOVAL (Right)  Anesthesia type: MAC  Patient location: PACU  Post pain: Pain level controlled  Post assessment: Patient's Cardiovascular Status Stable  Last Vitals:  Filed Vitals:   04/17/13 1100  BP: 138/92  Pulse: 72  Temp:   Resp: 16    Post vital signs: Reviewed and stable  Level of consciousness: sedated  Complications: No apparent anesthesia complications

## 2013-04-18 ENCOUNTER — Encounter (HOSPITAL_BASED_OUTPATIENT_CLINIC_OR_DEPARTMENT_OTHER): Payer: Self-pay | Admitting: Orthopedic Surgery

## 2013-05-15 ENCOUNTER — Ambulatory Visit
Admission: RE | Admit: 2013-05-15 | Discharge: 2013-05-15 | Disposition: A | Discharge status: Home / Self Care | Payer: PRIVATE HEALTH INSURANCE | Source: Ambulatory Visit | Attending: Orthopedic Surgery | Admitting: Orthopedic Surgery

## 2013-05-15 ENCOUNTER — Other Ambulatory Visit: Payer: Self-pay | Admitting: Orthopedic Surgery

## 2013-05-15 DIAGNOSIS — M79643 Pain in unspecified hand: Secondary | ICD-10-CM

## 2013-05-15 MED ORDER — GADOBENATE DIMEGLUMINE 529 MG/ML IV SOLN
20.0000 mL | Freq: Once | INTRAVENOUS | Status: AC | PRN
Start: 2013-05-15 — End: 2013-05-15
  Administered 2013-05-15: 20 mL via INTRAVENOUS

## 2013-05-16 ENCOUNTER — Other Ambulatory Visit: Payer: Self-pay | Admitting: Orthopedic Surgery

## 2013-05-16 DIAGNOSIS — M79642 Pain in left hand: Secondary | ICD-10-CM

## 2015-10-09 ENCOUNTER — Other Ambulatory Visit: Payer: Self-pay | Admitting: Gastroenterology

## 2016-01-06 DIAGNOSIS — Z8546 Personal history of malignant neoplasm of prostate: Secondary | ICD-10-CM | POA: Diagnosis not present

## 2016-01-13 DIAGNOSIS — Z8546 Personal history of malignant neoplasm of prostate: Secondary | ICD-10-CM | POA: Diagnosis not present

## 2016-02-25 DIAGNOSIS — E785 Hyperlipidemia, unspecified: Secondary | ICD-10-CM | POA: Diagnosis not present

## 2016-02-25 DIAGNOSIS — I10 Essential (primary) hypertension: Secondary | ICD-10-CM | POA: Diagnosis not present

## 2016-02-27 DIAGNOSIS — Z0000 Encounter for general adult medical examination without abnormal findings: Secondary | ICD-10-CM | POA: Diagnosis not present

## 2016-03-04 ENCOUNTER — Other Ambulatory Visit: Payer: Self-pay | Admitting: Gastroenterology

## 2016-03-04 DIAGNOSIS — R1310 Dysphagia, unspecified: Secondary | ICD-10-CM | POA: Diagnosis not present

## 2016-03-04 DIAGNOSIS — K219 Gastro-esophageal reflux disease without esophagitis: Secondary | ICD-10-CM | POA: Diagnosis not present

## 2016-03-10 ENCOUNTER — Ambulatory Visit
Admission: RE | Admit: 2016-03-10 | Discharge: 2016-03-10 | Disposition: A | Discharge status: Home / Self Care | Payer: BLUE CROSS/BLUE SHIELD | Source: Ambulatory Visit | Attending: Gastroenterology | Admitting: Gastroenterology

## 2016-03-10 DIAGNOSIS — R1310 Dysphagia, unspecified: Secondary | ICD-10-CM

## 2016-03-10 DIAGNOSIS — K219 Gastro-esophageal reflux disease without esophagitis: Secondary | ICD-10-CM | POA: Diagnosis not present

## 2016-03-25 DIAGNOSIS — R1310 Dysphagia, unspecified: Secondary | ICD-10-CM | POA: Diagnosis not present

## 2016-03-25 DIAGNOSIS — K219 Gastro-esophageal reflux disease without esophagitis: Secondary | ICD-10-CM | POA: Diagnosis not present

## 2016-05-25 DIAGNOSIS — Z23 Encounter for immunization: Secondary | ICD-10-CM | POA: Diagnosis not present

## 2016-05-25 DIAGNOSIS — R7301 Impaired fasting glucose: Secondary | ICD-10-CM | POA: Diagnosis not present

## 2017-01-25 DIAGNOSIS — Z125 Encounter for screening for malignant neoplasm of prostate: Secondary | ICD-10-CM | POA: Diagnosis not present

## 2017-01-25 DIAGNOSIS — E785 Hyperlipidemia, unspecified: Secondary | ICD-10-CM | POA: Diagnosis not present

## 2017-01-25 DIAGNOSIS — Z0000 Encounter for general adult medical examination without abnormal findings: Secondary | ICD-10-CM | POA: Diagnosis not present

## 2017-01-28 DIAGNOSIS — Z0000 Encounter for general adult medical examination without abnormal findings: Secondary | ICD-10-CM | POA: Diagnosis not present

## 2017-01-28 DIAGNOSIS — R3989 Other symptoms and signs involving the genitourinary system: Secondary | ICD-10-CM | POA: Diagnosis not present

## 2017-01-28 DIAGNOSIS — R05 Cough: Secondary | ICD-10-CM | POA: Diagnosis not present

## 2017-04-27 DIAGNOSIS — R748 Abnormal levels of other serum enzymes: Secondary | ICD-10-CM | POA: Diagnosis not present

## 2017-04-27 DIAGNOSIS — R7989 Other specified abnormal findings of blood chemistry: Secondary | ICD-10-CM | POA: Diagnosis not present

## 2017-04-27 DIAGNOSIS — R7301 Impaired fasting glucose: Secondary | ICD-10-CM | POA: Diagnosis not present

## 2017-04-27 DIAGNOSIS — D729 Disorder of white blood cells, unspecified: Secondary | ICD-10-CM | POA: Diagnosis not present

## 2017-06-21 DIAGNOSIS — I8310 Varicose veins of unspecified lower extremity with inflammation: Secondary | ICD-10-CM | POA: Diagnosis not present

## 2017-06-21 DIAGNOSIS — R609 Edema, unspecified: Secondary | ICD-10-CM | POA: Diagnosis not present

## 2017-06-22 DIAGNOSIS — Z9889 Other specified postprocedural states: Secondary | ICD-10-CM | POA: Diagnosis not present

## 2017-06-22 DIAGNOSIS — M7541 Impingement syndrome of right shoulder: Secondary | ICD-10-CM | POA: Diagnosis not present

## 2017-06-22 DIAGNOSIS — M25511 Pain in right shoulder: Secondary | ICD-10-CM | POA: Diagnosis not present

## 2017-06-22 DIAGNOSIS — M545 Low back pain: Secondary | ICD-10-CM | POA: Diagnosis not present

## 2017-08-24 DIAGNOSIS — M25512 Pain in left shoulder: Secondary | ICD-10-CM | POA: Diagnosis not present

## 2017-09-21 DIAGNOSIS — R899 Unspecified abnormal finding in specimens from other organs, systems and tissues: Secondary | ICD-10-CM | POA: Diagnosis not present

## 2017-09-21 DIAGNOSIS — R7301 Impaired fasting glucose: Secondary | ICD-10-CM | POA: Diagnosis not present

## 2017-12-14 DIAGNOSIS — M5136 Other intervertebral disc degeneration, lumbar region: Secondary | ICD-10-CM | POA: Diagnosis not present

## 2017-12-14 DIAGNOSIS — M545 Low back pain: Secondary | ICD-10-CM | POA: Diagnosis not present

## 2017-12-14 DIAGNOSIS — M5416 Radiculopathy, lumbar region: Secondary | ICD-10-CM | POA: Diagnosis not present

## 2017-12-28 DIAGNOSIS — M5416 Radiculopathy, lumbar region: Secondary | ICD-10-CM | POA: Diagnosis not present

## 2017-12-30 DIAGNOSIS — D2239 Melanocytic nevi of other parts of face: Secondary | ICD-10-CM | POA: Diagnosis not present

## 2017-12-30 DIAGNOSIS — D485 Neoplasm of uncertain behavior of skin: Secondary | ICD-10-CM | POA: Diagnosis not present

## 2017-12-30 DIAGNOSIS — Z85820 Personal history of malignant melanoma of skin: Secondary | ICD-10-CM | POA: Diagnosis not present

## 2017-12-30 DIAGNOSIS — D2372 Other benign neoplasm of skin of left lower limb, including hip: Secondary | ICD-10-CM | POA: Diagnosis not present

## 2017-12-30 DIAGNOSIS — D224 Melanocytic nevi of scalp and neck: Secondary | ICD-10-CM | POA: Diagnosis not present

## 2017-12-30 DIAGNOSIS — D225 Melanocytic nevi of trunk: Secondary | ICD-10-CM | POA: Diagnosis not present

## 2018-01-06 DIAGNOSIS — M4317 Spondylolisthesis, lumbosacral region: Secondary | ICD-10-CM | POA: Diagnosis not present

## 2018-01-06 DIAGNOSIS — M519 Unspecified thoracic, thoracolumbar and lumbosacral intervertebral disc disorder: Secondary | ICD-10-CM | POA: Diagnosis not present

## 2018-01-06 DIAGNOSIS — M545 Low back pain: Secondary | ICD-10-CM | POA: Diagnosis not present

## 2018-01-06 DIAGNOSIS — M4696 Unspecified inflammatory spondylopathy, lumbar region: Secondary | ICD-10-CM | POA: Diagnosis not present

## 2018-01-27 DIAGNOSIS — M47816 Spondylosis without myelopathy or radiculopathy, lumbar region: Secondary | ICD-10-CM | POA: Diagnosis not present

## 2018-02-02 DIAGNOSIS — R946 Abnormal results of thyroid function studies: Secondary | ICD-10-CM | POA: Diagnosis not present

## 2018-02-02 DIAGNOSIS — F1020 Alcohol dependence, uncomplicated: Secondary | ICD-10-CM | POA: Diagnosis not present

## 2018-02-02 DIAGNOSIS — R7301 Impaired fasting glucose: Secondary | ICD-10-CM | POA: Diagnosis not present

## 2018-02-02 DIAGNOSIS — Z8546 Personal history of malignant neoplasm of prostate: Secondary | ICD-10-CM | POA: Diagnosis not present

## 2018-02-02 DIAGNOSIS — Z0000 Encounter for general adult medical examination without abnormal findings: Secondary | ICD-10-CM | POA: Diagnosis not present

## 2018-02-02 DIAGNOSIS — I10 Essential (primary) hypertension: Secondary | ICD-10-CM | POA: Diagnosis not present

## 2018-02-02 DIAGNOSIS — E785 Hyperlipidemia, unspecified: Secondary | ICD-10-CM | POA: Diagnosis not present

## 2018-02-07 DIAGNOSIS — H43813 Vitreous degeneration, bilateral: Secondary | ICD-10-CM | POA: Diagnosis not present

## 2018-02-10 DIAGNOSIS — M519 Unspecified thoracic, thoracolumbar and lumbosacral intervertebral disc disorder: Secondary | ICD-10-CM | POA: Diagnosis not present

## 2018-02-10 DIAGNOSIS — M5136 Other intervertebral disc degeneration, lumbar region: Secondary | ICD-10-CM | POA: Diagnosis not present

## 2018-02-10 DIAGNOSIS — M4696 Unspecified inflammatory spondylopathy, lumbar region: Secondary | ICD-10-CM | POA: Diagnosis not present

## 2018-02-10 DIAGNOSIS — Z9889 Other specified postprocedural states: Secondary | ICD-10-CM | POA: Diagnosis not present

## 2018-02-18 DIAGNOSIS — M47816 Spondylosis without myelopathy or radiculopathy, lumbar region: Secondary | ICD-10-CM | POA: Diagnosis not present

## 2018-02-18 DIAGNOSIS — M5136 Other intervertebral disc degeneration, lumbar region: Secondary | ICD-10-CM | POA: Diagnosis not present

## 2018-03-07 DIAGNOSIS — M5136 Other intervertebral disc degeneration, lumbar region: Secondary | ICD-10-CM | POA: Diagnosis not present

## 2018-03-07 DIAGNOSIS — M47816 Spondylosis without myelopathy or radiculopathy, lumbar region: Secondary | ICD-10-CM | POA: Diagnosis not present

## 2018-03-07 DIAGNOSIS — M4696 Unspecified inflammatory spondylopathy, lumbar region: Secondary | ICD-10-CM | POA: Diagnosis not present

## 2018-03-07 DIAGNOSIS — M519 Unspecified thoracic, thoracolumbar and lumbosacral intervertebral disc disorder: Secondary | ICD-10-CM | POA: Diagnosis not present

## 2018-03-31 ENCOUNTER — Encounter: Payer: Self-pay | Admitting: *Deleted

## 2018-04-04 ENCOUNTER — Ambulatory Visit (INDEPENDENT_AMBULATORY_CARE_PROVIDER_SITE_OTHER): Payer: Medicare HMO | Admitting: Neurology

## 2018-04-04 ENCOUNTER — Encounter: Payer: Self-pay | Admitting: Neurology

## 2018-04-04 VITALS — BP 121/76 | HR 70 | Ht 73.0 in | Wt 238.0 lb

## 2018-04-04 DIAGNOSIS — H539 Unspecified visual disturbance: Secondary | ICD-10-CM

## 2018-04-04 DIAGNOSIS — G459 Transient cerebral ischemic attack, unspecified: Secondary | ICD-10-CM | POA: Diagnosis not present

## 2018-04-04 NOTE — Progress Notes (Signed)
PATIENT: Bradley Lambert DOB: 14-Apr-1955  Chief Complaint  Patient presents with  . Decreased Visual Acuity    He is here with his wife, [name].  Reports episode of vision loss in left eye (on 02/01/18).  His vision has returned but he is still experiencing some distortion. He has already seen his eye doctor without a known cause being found.  Marland Kitchen PCP    Rex Kras, Lennette Bihari, MD     HISTORICAL  Bradley Lambert is a 63 years old male, seen in request by his primary care physician Dr. Hulan Fess, for evaluation of decreased visual acuity, he is accompanied by his wife [name] at today's clinical visit.  Initial evaluation was on April 04, 2018  I have reviewed and summarized the referring note from the referring physician.  He has past medical history of hypertension, prostate cancer, reviewed ophthalmology evaluation by Dr. Alois Cliche dated February 07, 2018, evidence of nuclear sclerotic cataract, retinal degeneration.  On February 01, 2018, in the middle of the night, he got up using bathroom, when he looked at himself in the mirror he noticed his visual field was split down in the middle, on the right side vision was normal, left side was dark, squirming black, he cannot see anything through it, he thought he might having a migraine headache, went back to bed, the following morning,  He did not have headaches, the left visual field has changed to blotches of blurry vision, which lasted for few days, continue slow recovery, but still not normal yet, he can see a dark blurry spot at the left lower temporal visual field, if he closes his eyes, the spot would light up,  He did have a history of migraine headaches since childhood, but only a couple times each year, often preceded by visual distortion, lasting for few minutes followed by moderate headaches, worsening by movement, with associated light noise sensitivity, this time he only has mild headache, but persistent left visual field problem  described above  REVIEW OF SYSTEMS: Full 14 system review of systems performed and notable only for visual loss, cough All other review of systems were negative.  ALLERGIES: Allergies  Allergen Reactions  . Doxazosin Mesylate Other (See Comments)    intolerance  . Flomax [Tamsulosin Hcl] Other (See Comments)    intolerance  . Proscar [Finasteride] Other (See Comments)    intolerance    HOME MEDICATIONS: Current Outpatient Medications  Medication Sig Dispense Refill  . amLODipine (NORVASC) 10 MG tablet Take 10 mg by mouth daily.    . famotidine (PEPCID) 20 MG tablet Take 20 mg by mouth 2 (two) times daily.    Marland Kitchen ibuprofen (ADVIL,MOTRIN) 200 MG tablet Take 200 mg by mouth as needed.    Marland Kitchen losartan (COZAAR) 100 MG tablet Take 100 mg by mouth daily.    . traZODone (DESYREL) 50 MG tablet Take 50 mg by mouth at bedtime.     No current facility-administered medications for this visit.     PAST MEDICAL HISTORY: Past Medical History:  Diagnosis Date  . Anxiety   . Arthritis    back-arthritis  . BPH (benign prostatic hyperplasia)   . Chronic low back pain   . GERD (gastroesophageal reflux disease)   . History of melanoma   . Hypertension   . Insomnia   . Migraine   . Prostate cancer ([place] [facility]) 12-01-12   bx.x2- last- 6'14 -dx. prostate cancer  . Vision disturbance   . Wears glasses  PAST SURGICAL HISTORY: Past Surgical History:  Procedure Laterality Date  . COLONOSCOPY    . CYST EXCISION     forehead- sebaceous  . DERMOID CYST  EXCISION     x3 hands  . EYE SURGERY     radiocaratotomyx4  . FINGER GANGLION CYST EXCISION    . HERNIA REPAIR     hernia x3  . MASS EXCISION Right 04/17/2013   Procedure: RIGHT INDEX FINGER CYST REMOVAL;  Surgeon: Jolyn Nap, MD;  Location: [facility];  Service: Orthopedics;  Laterality: Right;  . MELANOMA EXCISION  12-01-12   mid.anterior chest -melanoma excised -11 yrs ago  . PAROTIDECTOMY  08/14/2011   Procedure:  PAROTIDECTOMY;  Surgeon: Rozetta Nunnery, MD;  Location: [facility];  Service: ENT;  Laterality: Left;  EXCISION PAROTID MASS WITH FACIAL NERVE DISSECTION  . ROBOT ASSISTED LAPAROSCOPIC RADICAL PROSTATECTOMY N/A 12/08/2012   Procedure: ROBOTIC ASSISTED LAPAROSCOPIC RADICAL PROSTATECTOMY LEVEL 3;  Surgeon: Dutch Gray, MD;  Location: [facility];  Service: Urology;  Laterality: N/A;  . ROTATOR CUFF REPAIR Right   . TONSILLECTOMY      FAMILY HISTORY: Family History  Problem Relation Age of Onset  . Pneumonia Mother   . Esophageal cancer Father   . Thyroid disease Sister   . Cancer Brother        eye  . Heart attack Brother   . Diabetes Brother   . Hypertension Brother   . CAD Brother   . Diverticulitis Maternal Grandfather   . Crohn's disease Maternal Aunt   . Heart disease Maternal Grandmother     SOCIAL HISTORY: Social History   Socioeconomic History  . Marital status: Married    Spouse name: Not on file  . Number of children: 2  . Years of education: college  . Highest education level: Bachelor's degree (e.g., BA, AB, BS)  Occupational History  . Occupation: retired  Scientific laboratory technician  . Financial resource strain: Not on file  . Food insecurity:    Worry: Not on file    Inability: Not on file  . Transportation needs:    Medical: Not on file    Non-medical: Not on file  Tobacco Use  . Smoking status: Never Smoker  . Smokeless tobacco: Never Used  Substance and Sexual Activity  . Alcohol use: Not Currently  . Drug use: No  . Sexual activity: Yes  Lifestyle  . Physical activity:    Days per week: Not on file    Minutes per session: Not on file  . Stress: Not on file  Relationships  . Social connections:    Talks on phone: Not on file    Gets together: Not on file    Attends religious service: Not on file    Active member of club or organization: Not on file    Attends meetings of clubs or organizations: Not on file    Relationship status: Not on  file  . Intimate partner violence:    Fear of current or ex partner: Not on file    Emotionally abused: Not on file    Physically abused: Not on file    Forced sexual activity: Not on file  Other Topics Concern  . Not on file  Social History Narrative   Lives with wife.   Right-handed.   1-2 cups caffeine daily.Marland Kitchen     PHYSICAL EXAM   Vitals:   04/04/18 1337  BP: 121/76  Pulse: 70  Weight: 238 lb (  108 kg)  Height: 6\' 1"  (1.854 m)    Not recorded      Body mass index is 31.4 kg/m.  PHYSICAL EXAMNIATION:  Gen: NAD, conversant, well nourised, obese, well groomed                     Cardiovascular: Regular rate rhythm, no peripheral edema, warm, nontender. Eyes: Conjunctivae clear without exudates or hemorrhage Neck: Supple, no carotid bruits. Pulmonary: Clear to auscultation bilaterally   NEUROLOGICAL EXAM:  MENTAL STATUS: Speech:    Speech is normal; fluent and spontaneous with normal comprehension.  Cognition:     Orientation to time, place and person     Normal recent and remote memory     Normal Attention span and concentration     Normal Language, naming, repeating,spontaneous speech     Fund of knowledge   CRANIAL NERVES: CN II: Visual fields are full to confrontation. Fundoscopic exam is normal with sharp discs and no vascular changes. Pupils are round equal and briskly reactive to light. CN III, IV, VI: extraocular movement are normal. No ptosis. CN V: Facial sensation is intact to pinprick in all 3 divisions bilaterally. Corneal responses are intact.  CN VII: Face is symmetric with normal eye closure and smile. CN VIII: Hearing is normal to rubbing fingers CN IX, X: Palate elevates symmetrically. Phonation is normal. CN XI: Head turning and shoulder shrug are intact CN XII: Tongue is midline with normal movements and no atrophy.  MOTOR: There is no pronator drift of out-stretched arms. Muscle bulk and tone are normal. Muscle strength is  normal.  REFLEXES: Reflexes are 2+ and symmetric at the biceps, triceps, knees, and ankles. Plantar responses are flexor.  SENSORY: Intact to light touch, pinprick, positional sensation and vibratory sensation are intact in fingers and toes.  COORDINATION: Rapid alternating movements and fine finger movements are intact. There is no dysmetria on finger-to-nose and heel-knee-shin.    GAIT/STANCE: Posture is normal. Gait is steady with normal steps, base, arm swing, and turning. Heel and toe walking are normal. Tandem gait is normal.  Romberg is absent.   DIAGNOSTIC DATA (LABS, IMAGING, TESTING) - I reviewed patient records, labs, notes, testing and imaging myself where available.   ASSESSMENT AND PLAN  LOYE VENTO is a 63 y.o. male   Left visual field distortion History of migraine headaches  Need to rule out right occipital lobe stroke  Proceed with MRI of brain without contrast  Echocardiogram  Ultrasound of carotid artery  Keep aspirin 81 mg daily,     Marcial Pacas, M.D. Ph.D.  [facility] [address], [place], [place] [address] Ph: [phone] Fax: [phone]  CC: Hulan Fess, MD

## 2018-04-05 ENCOUNTER — Telehealth: Payer: Self-pay | Admitting: Neurology

## 2018-04-05 NOTE — Telephone Encounter (Signed)
Medicare order sent to GI. No auth they will reach out to the pt to schedule.  °

## 2018-04-05 NOTE — Telephone Encounter (Signed)
Patient is scheduled at [facility] for Wednesday 04/13/18 arrival time is 12:45 pm patient is aware. I gave him their number of [phone] if he needed to r/s for any reason.

## 2018-04-05 NOTE — Telephone Encounter (Signed)
Spoke to the patient and he is aware

## 2018-04-11 ENCOUNTER — Telehealth: Payer: Self-pay | Admitting: Neurology

## 2018-04-11 NOTE — Telephone Encounter (Signed)
Pts wife called to schedule the pts MRI. Wife also states she has information on what number to call for precerification. Please advise.

## 2018-04-11 NOTE — Telephone Encounter (Signed)
[employer]na medicare auth: NPR ref # [id number].

## 2018-04-11 NOTE — Telephone Encounter (Signed)
I spoke to the patient wife she informed me their insurance has changed to [employer].Marland Kitchen aetna medicare auth: NPR ref # [id number]. For the echo/Carotid. The MRI has been sent to GI.

## 2018-04-13 ENCOUNTER — Telehealth: Payer: Self-pay | Admitting: *Deleted

## 2018-04-13 ENCOUNTER — Ambulatory Visit (HOSPITAL_BASED_OUTPATIENT_CLINIC_OR_DEPARTMENT_OTHER)
Admission: RE | Admit: 2018-04-13 | Discharge: 2018-04-13 | Disposition: A | Discharge status: Home / Self Care | Payer: Medicare HMO | Source: Ambulatory Visit | Attending: Neurology | Admitting: Neurology

## 2018-04-13 ENCOUNTER — Ambulatory Visit (HOSPITAL_COMMUNITY)
Admission: RE | Admit: 2018-04-13 | Discharge: 2018-04-13 | Disposition: A | Discharge status: Home / Self Care | Payer: Medicare HMO | Source: Ambulatory Visit | Attending: Neurology | Admitting: Neurology

## 2018-04-13 DIAGNOSIS — G459 Transient cerebral ischemic attack, unspecified: Secondary | ICD-10-CM

## 2018-04-13 DIAGNOSIS — Z8673 Personal history of transient ischemic attack (TIA), and cerebral infarction without residual deficits: Secondary | ICD-10-CM | POA: Diagnosis not present

## 2018-04-13 DIAGNOSIS — I080 Rheumatic disorders of both mitral and aortic valves: Secondary | ICD-10-CM | POA: Diagnosis not present

## 2018-04-13 DIAGNOSIS — H539 Unspecified visual disturbance: Secondary | ICD-10-CM | POA: Diagnosis not present

## 2018-04-13 NOTE — Telephone Encounter (Signed)
[employer] medicare Josem Kaufmann: [id number] (exp. 04/12/18 to 07/11/18) pt is scheduleda t GI for 04/17/18

## 2018-04-13 NOTE — Progress Notes (Signed)
Bilateral carotid duplex exam completed.   Incidental finding:  Hypoechoic lesion seen at right thyroid on medial lateral pole measuring 0.64x0.46x0.39cm.                                 Heterogenous lesion seen at left thyroid upper pole, measuring 0.68x0.52x1.14cm.           Further evaluation needed if there is any clinical concerns.   More details please see preliminary notes on CV PROC under chart review.   Edyth Glomb H Tarnesha Ulloa(RDMS RVT) 04/13/18 2:41 PM

## 2018-04-13 NOTE — Telephone Encounter (Signed)
-----   Message from Marcial Pacas, MD sent at 04/13/2018  2:54 PM EST ----- Please call patient: Ultrasound of carotid arteries showed no significant abnormality. Incidental findings of left thyroid upper lobe nodules measuring 0.68 x 0.52 x 1.14 cm.

## 2018-04-13 NOTE — Telephone Encounter (Signed)
[employer] medicare Josem Kaufmann: [id number] (exp. 04/12/18 to 07/11/18) pt is scheduleda t [facility] for 04/17/18

## 2018-04-13 NOTE — Telephone Encounter (Signed)
Spoke to patient - he is aware of his results and verbalized understanding.  States he will further discuss the incidental findings with his PCP.  The results have been faxed to his PCP, Dr. Hulan Fess.

## 2018-04-17 ENCOUNTER — Ambulatory Visit
Admission: RE | Admit: 2018-04-17 | Discharge: 2018-04-17 | Disposition: A | Discharge status: Home / Self Care | Payer: Medicare HMO | Source: Ambulatory Visit | Attending: Neurology | Admitting: Neurology

## 2018-04-17 DIAGNOSIS — H539 Unspecified visual disturbance: Secondary | ICD-10-CM | POA: Diagnosis not present

## 2018-04-27 DIAGNOSIS — Z6830 Body mass index (BMI) 30.0-30.9, adult: Secondary | ICD-10-CM | POA: Diagnosis not present

## 2018-04-27 DIAGNOSIS — E042 Nontoxic multinodular goiter: Secondary | ICD-10-CM | POA: Diagnosis not present

## 2018-05-03 ENCOUNTER — Other Ambulatory Visit: Payer: Self-pay | Admitting: Internal Medicine

## 2018-05-03 DIAGNOSIS — E042 Nontoxic multinodular goiter: Secondary | ICD-10-CM

## 2018-05-09 ENCOUNTER — Ambulatory Visit
Admission: RE | Admit: 2018-05-09 | Discharge: 2018-05-09 | Disposition: A | Discharge status: Home / Self Care | Payer: Medicare HMO | Source: Ambulatory Visit | Attending: Internal Medicine | Admitting: Internal Medicine

## 2018-05-09 ENCOUNTER — Other Ambulatory Visit: Payer: Self-pay

## 2018-05-09 DIAGNOSIS — E042 Nontoxic multinodular goiter: Secondary | ICD-10-CM | POA: Diagnosis not present

## 2018-05-11 ENCOUNTER — Other Ambulatory Visit: Payer: Self-pay | Admitting: Internal Medicine

## 2018-05-13 ENCOUNTER — Other Ambulatory Visit: Payer: Self-pay | Admitting: Internal Medicine

## 2018-05-13 DIAGNOSIS — E042 Nontoxic multinodular goiter: Secondary | ICD-10-CM

## 2018-06-16 ENCOUNTER — Ambulatory Visit: Payer: Medicare Other | Admitting: Neurology

## 2018-07-15 DIAGNOSIS — Z03818 Encounter for observation for suspected exposure to other biological agents ruled out: Secondary | ICD-10-CM | POA: Diagnosis not present

## 2018-12-02 DIAGNOSIS — Z23 Encounter for immunization: Secondary | ICD-10-CM | POA: Diagnosis not present

## 2019-01-04 DIAGNOSIS — L821 Other seborrheic keratosis: Secondary | ICD-10-CM | POA: Diagnosis not present

## 2019-01-04 DIAGNOSIS — D692 Other nonthrombocytopenic purpura: Secondary | ICD-10-CM | POA: Diagnosis not present

## 2019-01-04 DIAGNOSIS — Z85820 Personal history of malignant melanoma of skin: Secondary | ICD-10-CM | POA: Diagnosis not present

## 2019-01-04 DIAGNOSIS — D225 Melanocytic nevi of trunk: Secondary | ICD-10-CM | POA: Diagnosis not present

## 2019-01-04 DIAGNOSIS — D224 Melanocytic nevi of scalp and neck: Secondary | ICD-10-CM | POA: Diagnosis not present

## 2019-01-04 DIAGNOSIS — D2239 Melanocytic nevi of other parts of face: Secondary | ICD-10-CM | POA: Diagnosis not present

## 2019-01-04 DIAGNOSIS — D2372 Other benign neoplasm of skin of left lower limb, including hip: Secondary | ICD-10-CM | POA: Diagnosis not present

## 2019-01-04 DIAGNOSIS — Z23 Encounter for immunization: Secondary | ICD-10-CM | POA: Diagnosis not present

## 2019-02-13 DIAGNOSIS — I10 Essential (primary) hypertension: Secondary | ICD-10-CM | POA: Diagnosis not present

## 2019-02-13 DIAGNOSIS — G4700 Insomnia, unspecified: Secondary | ICD-10-CM | POA: Diagnosis not present

## 2019-02-13 DIAGNOSIS — R7301 Impaired fasting glucose: Secondary | ICD-10-CM | POA: Diagnosis not present

## 2019-02-13 DIAGNOSIS — E785 Hyperlipidemia, unspecified: Secondary | ICD-10-CM | POA: Diagnosis not present

## 2019-02-13 DIAGNOSIS — Z8546 Personal history of malignant neoplasm of prostate: Secondary | ICD-10-CM | POA: Diagnosis not present

## 2019-02-13 DIAGNOSIS — K219 Gastro-esophageal reflux disease without esophagitis: Secondary | ICD-10-CM | POA: Diagnosis not present

## 2019-02-13 DIAGNOSIS — Z85820 Personal history of malignant melanoma of skin: Secondary | ICD-10-CM | POA: Diagnosis not present

## 2020-01-26 ENCOUNTER — Ambulatory Visit (INDEPENDENT_AMBULATORY_CARE_PROVIDER_SITE_OTHER): Payer: Medicare Other | Admitting: Podiatry

## 2020-01-26 ENCOUNTER — Ambulatory Visit (INDEPENDENT_AMBULATORY_CARE_PROVIDER_SITE_OTHER): Payer: Medicare Other

## 2020-01-26 ENCOUNTER — Other Ambulatory Visit: Payer: Self-pay

## 2020-01-26 VITALS — BP 137/86 | HR 80 | Temp 98.7°F | Resp 16

## 2020-01-26 DIAGNOSIS — M779 Enthesopathy, unspecified: Secondary | ICD-10-CM

## 2020-01-26 DIAGNOSIS — M7751 Other enthesopathy of right foot: Secondary | ICD-10-CM | POA: Diagnosis not present

## 2020-01-26 DIAGNOSIS — M7752 Other enthesopathy of left foot: Secondary | ICD-10-CM | POA: Diagnosis not present

## 2020-01-26 DIAGNOSIS — R52 Pain, unspecified: Secondary | ICD-10-CM | POA: Diagnosis not present

## 2020-01-26 MED ORDER — MELOXICAM 15 MG PO TABS: 15.0000 mg | ORAL_TABLET | Freq: Every day | ORAL | 1 refills | Status: DC

## 2020-01-26 MED ORDER — BETAMETHASONE SOD PHOS & ACET 6 (3-3) MG/ML IJ SUSP
3.0000 mg | Freq: Once | INTRAMUSCULAR | Status: AC
  Administered 2020-01-26: 3 mg via INTRAMUSCULAR

## 2020-01-26 NOTE — Progress Notes (Signed)
   HPI: 64 y.o. male presenting today for evaluation of left great toe pain that began approximately 1 day ago.  Sudden onset.  Patient states that he noticed swelling and soreness.  He is unable to put pressure on the toe.  He has been taking ibuprofen for treatment.  He states that it possibly began when he was up and down a lot of stairs when cleaning out his basement most recently yesterday.  He presents for further treatment and evaluation  Past Medical History:  Diagnosis Date  . Anxiety   . Arthritis    back-arthritis  . BPH (benign prostatic hyperplasia)   . Chronic low back pain   . GERD (gastroesophageal reflux disease)   . History of melanoma   . Hypertension   . Insomnia   . Migraine   . Prostate cancer ([facility]) 12-01-12   bx.x2- last- 6'14 -dx. prostate cancer  . Vision disturbance   . Wears glasses      Physical Exam: General: The patient is alert and oriented x3 in no acute distress.  Dermatology: Skin is warm, dry and supple bilateral lower extremities. Negative for open lesions or macerations.  Vascular: Palpable pedal pulses bilaterally. No edema or erythema noted. Capillary refill within normal limits.  Neurological: Epicritic and protective threshold grossly intact bilaterally.   Musculoskeletal Exam: Range of motion within normal limits to all pedal and ankle joints bilateral. Muscle strength 5/5 in all groups bilateral.  Edema with tenderness palpation and range of motion of the first MTPJ left foot  Radiographic Exam:  Normal osseous mineralization. Joint spaces preserved. No fracture/dislocation/boney destruction.    Assessment: 1.  First MTPJ capsulitis left   Plan of Care:  1. Patient evaluated. X-Rays reviewed.  2.  Injection of 0.5 cc Celestone Soluspan injected into the first MTPJ left foot 3.  Prescription for meloxicam to take daily 4.  Postsurgical shoe dispensed.  Weightbearing as tolerated 5.  Return to clinic in 3 weeks      Edrick Kins, DPM [facility]  Dr. Edrick Kins, DPM    [address], [place] [address]                Office [phone]  Fax [phone]

## 2020-02-19 ENCOUNTER — Encounter: Payer: Self-pay | Admitting: Podiatry

## 2020-02-19 ENCOUNTER — Ambulatory Visit (INDEPENDENT_AMBULATORY_CARE_PROVIDER_SITE_OTHER): Payer: Medicare Other | Admitting: Podiatry

## 2020-02-19 ENCOUNTER — Other Ambulatory Visit: Payer: Self-pay

## 2020-02-19 DIAGNOSIS — M7752 Other enthesopathy of left foot: Secondary | ICD-10-CM | POA: Diagnosis not present

## 2020-02-19 DIAGNOSIS — M779 Enthesopathy, unspecified: Secondary | ICD-10-CM

## 2020-02-19 NOTE — Progress Notes (Signed)
   HPI: 64 y.o. male presenting today for follow-up of left great toe pain/capsulitis. Patient states his pain has 95% improved. He has been wearing surgical shoe. He would like to know when he can return to regular activities.  Past Medical History:  Diagnosis Date  . Anxiety   . Arthritis    back-arthritis  . BPH (benign prostatic hyperplasia)   . Chronic low back pain   . GERD (gastroesophageal reflux disease)   . History of melanoma   . Hypertension   . Insomnia   . Migraine   . Prostate cancer ([place]) 12-01-12   bx.x2- last- 6'14 -dx. prostate cancer  . Vision disturbance   . Wears glasses      Physical Exam: General: The patient is alert and oriented x3 in no acute distress.  Dermatology: Skin is warm, dry and supple bilateral lower extremities. Negative for open lesions or macerations.  Vascular: Palpable pedal pulses bilaterally. No edema or erythema noted. Capillary refill within normal limits.  Neurological: Epicritic and protective threshold grossly intact bilaterally.   Musculoskeletal Exam: Range of motion within normal limits to all pedal and ankle joints bilateral. Muscle strength 5/5 in all groups bilateral. No further edema without tenderness palpation and range of motion of the first MTPJ left foot  Radiographic Exam:  Normal osseous mineralization. Joint spaces preserved. No fracture/dislocation/boney destruction.    Assessment: 1.  First MTPJ capsulitis left   Plan of Care:  1. Patient evaluated. 2. I will hold off on any further injection as is his pain is greater than 95% improved. 3. Continue taking meloxicam as needed 4. Start transition from postop surgical shoe to regular shoes 5. Return as needed.

## 2020-02-20 ENCOUNTER — Ambulatory Visit: Payer: Medicare Other | Admitting: Podiatry

## 2020-03-18 ENCOUNTER — Ambulatory Visit (INDEPENDENT_AMBULATORY_CARE_PROVIDER_SITE_OTHER): Payer: Medicare Other

## 2020-03-18 ENCOUNTER — Ambulatory Visit (INDEPENDENT_AMBULATORY_CARE_PROVIDER_SITE_OTHER): Payer: Medicare Other | Admitting: Podiatry

## 2020-03-18 ENCOUNTER — Other Ambulatory Visit: Payer: Self-pay

## 2020-03-18 DIAGNOSIS — M7752 Other enthesopathy of left foot: Secondary | ICD-10-CM | POA: Diagnosis not present

## 2020-03-18 DIAGNOSIS — M2580 Other specified joint disorders, unspecified joint: Secondary | ICD-10-CM

## 2020-03-18 MED ORDER — BETAMETHASONE SOD PHOS & ACET 6 (3-3) MG/ML IJ SUSP
3.0000 mg | Freq: Once | INTRAMUSCULAR | Status: AC
  Administered 2020-03-18: 3 mg via INTRAMUSCULAR

## 2020-03-18 MED ORDER — BETAMETHASONE SOD PHOS & ACET 6 (3-3) MG/ML IJ SUSP
3.0000 mg | Freq: Once | INTRAMUSCULAR | Status: AC
  Administered 2020-03-18: 3 mg

## 2020-03-18 MED ORDER — MELOXICAM 15 MG PO TABS: 15.0000 mg | ORAL_TABLET | Freq: Every day | ORAL | 1 refills | Status: DC

## 2020-03-18 MED ORDER — BETAMETHASONE SOD PHOS & ACET 30 MG/5ML IJ KIT: 1.0000 mL | PACK | Freq: Once | INTRAMUSCULAR | 0 refills | Status: DC

## 2020-03-18 NOTE — Progress Notes (Signed)
   HPI: 65 y.o. male presenting today for follow-up evaluation of sesamoiditis to the left foot.  Patient states the injection last time helped significantly however he has been increased activity with shoveling snow due to the recent snow storms and he began to have an increase of pain and tenderness to the plantar aspect of the left first MTPJ again.  He presents for further treatment evaluation Past Medical History:  Diagnosis Date  . Anxiety   . Arthritis    back-arthritis  . BPH (benign prostatic hyperplasia)   . Chronic low back pain   . GERD (gastroesophageal reflux disease)   . History of melanoma   . Hypertension   . Insomnia   . Migraine   . Prostate cancer ([place]) 12-01-12   bx.x2- last- 6'14 -dx. prostate cancer  . Vision disturbance   . Wears glasses      Physical Exam: General: The patient is alert and oriented x3 in no acute distress.  Dermatology: Skin is warm, dry and supple bilateral lower extremities. Negative for open lesions or macerations.  Vascular: Palpable pedal pulses bilaterally. No edema or erythema noted. Capillary refill within normal limits.  Neurological: Epicritic and protective threshold grossly intact bilaterally.   Musculoskeletal Exam: Range of motion within normal limits to all pedal and ankle joints bilateral. Muscle strength 5/5 in all groups bilateral.  Edema with tenderness palpation and range of motion of the first MTPJ left foot and sesamoids  Radiographic Exam:  Normal osseous mineralization. Joint spaces preserved. No fracture/dislocation/boney destruction.  No fracture identified through the sesamoidal apparatus  Assessment: 1.  First MTPJ capsulitis left   Plan of Care:  1. Patient evaluated. X-Rays reviewed.  2.  Injection of 0.5 cc Celestone Soluspan injected into the first MTPJ left foot 3.  Refill prescription for meloxicam to take daily 4.  Resume postsurgical shoe.  This time we will do weightbearing as tolerated x6 weeks   5.  Return to clinic in 6 weeks      Edrick Kins, DPM [facility]  Dr. Edrick Kins, DPM    [address], [place] [address]                Office [phone]  Fax [phone]

## 2020-03-18 NOTE — Addendum Note (Signed)
Addended by: Clovis Riley E on: 03/18/2020 11:32 AM   Modules accepted: Orders

## 2020-04-06 ENCOUNTER — Encounter: Payer: Self-pay | Admitting: Podiatry

## 2020-04-08 ENCOUNTER — Other Ambulatory Visit: Payer: Self-pay

## 2020-04-08 MED ORDER — MELOXICAM 15 MG PO TABS: 15.0000 mg | ORAL_TABLET | Freq: Every day | ORAL | 1 refills | Status: DC

## 2020-04-08 NOTE — Telephone Encounter (Signed)
Sent refill for Meloxicam per Dr. Amalia Hailey verbal order

## 2020-04-29 ENCOUNTER — Ambulatory Visit: Payer: Medicare Other | Admitting: Podiatry

## 2020-05-01 ENCOUNTER — Other Ambulatory Visit: Payer: Self-pay

## 2020-05-01 ENCOUNTER — Ambulatory Visit (INDEPENDENT_AMBULATORY_CARE_PROVIDER_SITE_OTHER): Payer: Medicare Other | Admitting: Podiatry

## 2020-05-01 DIAGNOSIS — M7752 Other enthesopathy of left foot: Secondary | ICD-10-CM | POA: Diagnosis not present

## 2020-05-01 DIAGNOSIS — M2580 Other specified joint disorders, unspecified joint: Secondary | ICD-10-CM | POA: Diagnosis not present

## 2020-05-05 ENCOUNTER — Ambulatory Visit
Admission: RE | Admit: 2020-05-05 | Discharge: 2020-05-05 | Disposition: A | Discharge status: Home / Self Care | Payer: Medicare Other | Source: Ambulatory Visit | Attending: Podiatry | Admitting: Podiatry

## 2020-05-05 DIAGNOSIS — M2580 Other specified joint disorders, unspecified joint: Secondary | ICD-10-CM

## 2020-05-05 DIAGNOSIS — M7752 Other enthesopathy of left foot: Secondary | ICD-10-CM

## 2020-05-13 NOTE — Progress Notes (Signed)
   HPI: 65 y.o. male presenting today for follow-up evaluation of sesamoiditis to the left foot.  Patient states the injection last time helped significantly however he has been increased activity with shoveling snow due to the recent snow storms and he began to have an increase of pain and tenderness to the plantar aspect of the left first MTPJ again.  He presents for further treatment evaluation Past Medical History:  Diagnosis Date  . Anxiety   . Arthritis    back-arthritis  . BPH (benign prostatic hyperplasia)   . Chronic low back pain   . GERD (gastroesophageal reflux disease)   . History of melanoma   . Hypertension   . Insomnia   . Migraine   . Prostate cancer ([place]) 12-01-12   bx.x2- last- 6'14 -dx. prostate cancer  . Vision disturbance   . Wears glasses      Physical Exam: General: The patient is alert and oriented x3 in no acute distress.  Dermatology: Skin is warm, dry and supple bilateral lower extremities. Negative for open lesions or macerations.  Vascular: Palpable pedal pulses bilaterally. No edema or erythema noted. Capillary refill within normal limits.  Neurological: Epicritic and protective threshold grossly intact bilaterally.   Musculoskeletal Exam: Range of motion within normal limits to all pedal and ankle joints bilateral. Muscle strength 5/5 in all groups bilateral.  Edema with tenderness palpation and range of motion of the first MTPJ left foot and sesamoids  Radiographic Exam:  Normal osseous mineralization. Joint spaces preserved. No fracture/dislocation/boney destruction.  No fracture identified through the sesamoidal apparatus  Assessment: 1.  First MTPJ capsulitis left 2.  Sesamoiditis vs. sesamoid fracture left   Plan of Care:  1. Patient evaluated.  2.  The patient has not improved with any conservative treatment.  He states that overall the pain may be worse.  Today we are going to order MRI left forefoot to rule out sesamoid fracture 3.   Continue meloxicam daily as needed 4.  Return to clinic after MRI to review results     Edrick Kins, DPM [facility]  Dr. Edrick Kins, DPM    [address], [place] [address]                Office [phone]  Fax [phone]

## 2020-05-14 ENCOUNTER — Ambulatory Visit (INDEPENDENT_AMBULATORY_CARE_PROVIDER_SITE_OTHER): Payer: Medicare Other | Admitting: Podiatry

## 2020-05-14 ENCOUNTER — Other Ambulatory Visit: Payer: Self-pay

## 2020-05-14 DIAGNOSIS — M25872 Other specified joint disorders, left ankle and foot: Secondary | ICD-10-CM | POA: Diagnosis not present

## 2020-05-14 DIAGNOSIS — M7752 Other enthesopathy of left foot: Secondary | ICD-10-CM | POA: Diagnosis not present

## 2020-05-14 NOTE — Progress Notes (Signed)
   HPI: 65 y.o. male presenting today for follow-up evaluation of sesamoiditis to the left foot.  Patient recently had an MRI performed.  He continues to have pain with swelling to the area of concern.  He presents to discuss MRI results and further treatment options  Past Medical History:  Diagnosis Date  . Anxiety   . Arthritis    back-arthritis  . BPH (benign prostatic hyperplasia)   . Chronic low back pain   . GERD (gastroesophageal reflux disease)   . History of melanoma   . Hypertension   . Insomnia   . Migraine   . Prostate cancer ([place]) 12-01-12   bx.x2- last- 6'14 -dx. prostate cancer  . Vision disturbance   . Wears glasses      Physical Exam: General: The patient is alert and oriented x3 in no acute distress.  Dermatology: Skin is warm, dry and supple bilateral lower extremities. Negative for open lesions or macerations.  Vascular: Palpable pedal pulses bilaterally. No edema or erythema noted. Capillary refill within normal limits.  Neurological: Epicritic and protective threshold grossly intact bilaterally.   Musculoskeletal Exam: Range of motion within normal limits to all pedal and ankle joints bilateral. Muscle strength 5/5 in all groups bilateral.  Edema with tenderness palpation and range of motion of the first MTPJ left foot and sesamoids  MRI impression 05/05/2020 left foot Severe bone marrow edema in the lateral hallux sesamoid as can be seen with sesamoiditis with a possible subtle nondisplaced fracture peripherally along the lateral aspect. No other fracture or dislocation. Normal alignment. No joint effusion.  1. Severe bone marrow edema in the lateral hallux sesamoid as can be seen with sesamoiditis with a possible subtle nondisplaced fracture peripherally along the lateral aspect. 2. Moderate osteoarthritis of the first MTP joint. 3. Mild osteoarthritis of the second TMT joint.  Assessment: 1.  First MTPJ capsulitis left 2.  Sesamoiditis vs. sesamoid  fracture left   Plan of Care:  1. Patient evaluated.  MRI reviewed today 2.  Cam boot dispensed today.  Minimal weightbearing as tolerated 3.  Recommend that for the majority of the time the patient be NWB.  Prescription provided for the patient for a knee scooter 4.  At the moment we are going to treat the sesamoid injury conservatively to see if it will heal.  Return to clinic in 6 weeks to reevaluate.  If there is no improvement there continues to be swelling and pain we may need to discuss possible sesamoidectomy   Edrick Kins, DPM [facility]  Dr. Edrick Kins, DPM    [address], [place] [address]                Office [phone]  Fax [phone]

## 2020-06-26 ENCOUNTER — Other Ambulatory Visit: Payer: Self-pay

## 2020-06-26 ENCOUNTER — Ambulatory Visit (INDEPENDENT_AMBULATORY_CARE_PROVIDER_SITE_OTHER): Payer: Medicare Other | Admitting: Podiatry

## 2020-06-26 DIAGNOSIS — M25872 Other specified joint disorders, left ankle and foot: Secondary | ICD-10-CM

## 2020-07-11 NOTE — Progress Notes (Signed)
   HPI: 65 y.o. male presenting today for follow-up evaluation of sesamoiditis to the left foot.  Patient states that he is doing much better.  He states he is approximately 90 to 95% better.  He has been full activity and good supportive sneakers.  No new complaints at this time  Past Medical History:  Diagnosis Date  . Anxiety   . Arthritis    back-arthritis  . BPH (benign prostatic hyperplasia)   . Chronic low back pain   . GERD (gastroesophageal reflux disease)   . History of melanoma   . Hypertension   . Insomnia   . Migraine   . Prostate cancer ([place]) 12-01-12   bx.x2- last- 6'14 -dx. prostate cancer  . Vision disturbance   . Wears glasses      Physical Exam: General: The patient is alert and oriented x3 in no acute distress.  Dermatology: Skin is warm, dry and supple bilateral lower extremities. Negative for open lesions or macerations.  Vascular: Palpable pedal pulses bilaterally. No edema or erythema noted. Capillary refill within normal limits.  Neurological: Epicritic and protective threshold grossly intact bilaterally.   Musculoskeletal Exam: Range of motion within normal limits to all pedal and ankle joints bilateral. Muscle strength 5/5 in all groups bilateral.  Significantly improved edema with tenderness to the first MTPJ of the left foot.  Patient is basically asymptomatic with clinical exam  MRI impression 05/05/2020 left foot Severe bone marrow edema in the lateral hallux sesamoid as can be seen with sesamoiditis with a possible subtle nondisplaced fracture peripherally along the lateral aspect. No other fracture or dislocation. Normal alignment. No joint effusion.  1. Severe bone marrow edema in the lateral hallux sesamoid as can be seen with sesamoiditis with a possible subtle nondisplaced fracture peripherally along the lateral aspect. 2. Moderate osteoarthritis of the first MTP joint. 3. Mild osteoarthritis of the second TMT joint.  Assessment: 1.   First MTPJ capsulitis left 2.  Sesamoiditis vs. sesamoid fracture left   Plan of Care:  1. Patient evaluated.  Patient states that he is approximately 90-95% better.  He is basically full activity with no restrictions. 2.  Patient may resume full activity no restrictions and good stability sneakers and shoes 3.  Return to clinic as needed  Edrick Kins, DPM [facility]  Dr. Edrick Kins, DPM    [address], [place] [address]                Office [phone]  Fax [phone]

## 2020-07-26 ENCOUNTER — Encounter: Payer: Self-pay | Admitting: Podiatry

## 2020-07-31 NOTE — Telephone Encounter (Signed)
Please contact patient and have him come in for an appt and surgical consult. - Dr. Amalia Hailey

## 2020-08-01 ENCOUNTER — Other Ambulatory Visit: Payer: Self-pay

## 2020-08-05 ENCOUNTER — Other Ambulatory Visit: Payer: Self-pay

## 2020-08-05 ENCOUNTER — Ambulatory Visit (INDEPENDENT_AMBULATORY_CARE_PROVIDER_SITE_OTHER): Payer: Medicare Other | Admitting: Podiatry

## 2020-08-05 DIAGNOSIS — M25872 Other specified joint disorders, left ankle and foot: Secondary | ICD-10-CM

## 2020-08-05 NOTE — Progress Notes (Signed)
   HPI: 65 y.o. male presenting today for follow-up evaluation of sesamoiditis to the left foot.  Patient states that the pain has slowly returned since last visit.  It is now very excruciating and debilitating.  He states that wearing the boot helps however as soon as he gets out of the boot the pain returned.  He presents for further treatment and evaluation  Past Medical History:  Diagnosis Date   Anxiety    Arthritis    back-arthritis   BPH (benign prostatic hyperplasia)    Chronic low back pain    GERD (gastroesophageal reflux disease)    History of melanoma    Hypertension    Insomnia    Migraine    Prostate cancer ([place]) 12-01-12   bx.x2- last- 6'14 -dx. prostate cancer   Vision disturbance    Wears glasses      Physical Exam: General: The patient is alert and oriented x3 in no acute distress.  Dermatology: Skin is warm, dry and supple bilateral lower extremities. Negative for open lesions or macerations.  Vascular: Palpable pedal pulses bilaterally. No edema or erythema noted. Capillary refill within normal limits.  Neurological: Epicritic and protective threshold grossly intact bilaterally.   Musculoskeletal Exam: Range of motion within normal limits to all pedal and ankle joints bilateral. Muscle strength 5/5 in all groups bilateral.  Severe pain to palpation of the fibular sesamoid left foot  MRI impression 05/05/2020 left foot Severe bone marrow edema in the lateral hallux sesamoid as can be seen with sesamoiditis with a possible subtle nondisplaced fracture peripherally along the lateral aspect. No other fracture or dislocation. Normal alignment. No joint effusion.  1. Severe bone marrow edema in the lateral hallux sesamoid as can be seen with sesamoiditis with a possible subtle nondisplaced fracture peripherally along the lateral aspect. 2. Moderate osteoarthritis of the first MTP joint. 3. Mild osteoarthritis of the second TMT joint.  Assessment: 1.  First  MTPJ capsulitis left 2.  Sesamoiditis vs. sesamoid fracture left   Plan of Care:  1. Patient evaluated.  Patient states that since last visit the pain has slowly returned and it is now extremely sensitive and painful.  It is very debilitating and he is unable to walk any amount of distance without having pain to the area. 2. Today we discussed the conservative versus surgical management of the presenting pathology. The patient opts for surgical management. All possible complications and details of the procedure were explained. All patient questions were answered. No guarantees were expressed or implied. 3. Authorization for surgery was initiated today. Surgery will consist of fibular sesamoidectomy left 4.  Cam boot dispensed. 5.  Return to clinic 1 week postop  Edrick Kins, DPM [facility]  Dr. Edrick Kins, DPM    [address], [place] [address]                Office [phone]  Fax [phone]

## 2020-08-11 ENCOUNTER — Other Ambulatory Visit: Payer: Self-pay | Admitting: Podiatry

## 2020-09-05 ENCOUNTER — Other Ambulatory Visit: Payer: Self-pay | Admitting: Podiatry

## 2020-09-05 ENCOUNTER — Encounter: Payer: Self-pay | Admitting: Podiatry

## 2020-09-05 DIAGNOSIS — M25872 Other specified joint disorders, left ankle and foot: Secondary | ICD-10-CM

## 2020-09-05 MED ORDER — OXYCODONE-ACETAMINOPHEN 5-325 MG PO TABS: 1.0000 | ORAL_TABLET | ORAL | 0 refills | Status: DC | PRN

## 2020-09-05 NOTE — Progress Notes (Signed)
PRN postop 

## 2020-09-09 ENCOUNTER — Ambulatory Visit (INDEPENDENT_AMBULATORY_CARE_PROVIDER_SITE_OTHER): Payer: Medicare Other | Admitting: Podiatry

## 2020-09-09 ENCOUNTER — Other Ambulatory Visit: Payer: Self-pay

## 2020-09-09 DIAGNOSIS — Z9889 Other specified postprocedural states: Secondary | ICD-10-CM

## 2020-09-09 NOTE — Progress Notes (Signed)
   Subjective:  Patient presents today status post fibular sesamoidectomy left foot. DOS: 09/05/2020.  Patient states that he is doing well.  He states that yesterday he took a shower and got his dressings wet.  He presents today for dressing change.  He has been weightbearing in the cam boot as instructed.  Past Medical History:  Diagnosis Date   Anxiety    Arthritis    back-arthritis   BPH (benign prostatic hyperplasia)    Chronic low back pain    GERD (gastroesophageal reflux disease)    History of melanoma    Hypertension    Insomnia    Migraine    Prostate cancer ([place]) 12-01-12   bx.x2- last- 6'14 -dx. prostate cancer   Vision disturbance    Wears glasses       Objective/Physical Exam Neurovascular status intact.  Skin incisions appear to be well coapted with sutures intact. No sign of infectious process noted. No dehiscence. No active bleeding noted. Moderate edema noted to the surgical extremity.    Assessment: 1. s/p fibular sesamoidectomy left. DOS: 09/05/2020   Plan of Care:  1. Patient was evaluated.  2.  Overall the patient is doing very well.  Patient may begin washing and showering and getting the foot wet 3.  Recommend Ace wrap daily.  Ace wrap provided 4.  Continue weightbearing in the cam boot  5.  Return to clinic in 2 weeks for suture removal and follow-up x-ray   Edrick Kins, DPM [facility]  Dr. Edrick Kins, DPM    [address], [place] [address]                Office [phone]  Fax [phone]

## 2020-09-11 ENCOUNTER — Encounter: Payer: Medicare Other | Admitting: Podiatry

## 2020-09-18 ENCOUNTER — Encounter: Payer: Medicare Other | Admitting: Podiatry

## 2020-09-23 ENCOUNTER — Other Ambulatory Visit: Payer: Self-pay

## 2020-09-23 ENCOUNTER — Ambulatory Visit (INDEPENDENT_AMBULATORY_CARE_PROVIDER_SITE_OTHER): Payer: Medicare Other

## 2020-09-23 ENCOUNTER — Ambulatory Visit (INDEPENDENT_AMBULATORY_CARE_PROVIDER_SITE_OTHER): Payer: Medicare Other | Admitting: Podiatry

## 2020-09-23 DIAGNOSIS — M25872 Other specified joint disorders, left ankle and foot: Secondary | ICD-10-CM | POA: Diagnosis not present

## 2020-09-23 DIAGNOSIS — Z9889 Other specified postprocedural states: Secondary | ICD-10-CM

## 2020-09-23 NOTE — Progress Notes (Signed)
   Subjective:  Patient presents today status post fibular sesamoidectomy left foot. DOS: 09/05/2020.  Patient states that he is doing well.  He has been walking in the postsurgical shoe with no complaints.  He is doing very well and denies any pain to the foot.  Past Medical History:  Diagnosis Date   Anxiety    Arthritis    back-arthritis   BPH (benign prostatic hyperplasia)    Chronic low back pain    GERD (gastroesophageal reflux disease)    History of melanoma    Hypertension    Insomnia    Migraine    Prostate cancer ([place]) 12-01-12   bx.x2- last- 6'14 -dx. prostate cancer   Vision disturbance    Wears glasses       Objective/Physical Exam Neurovascular status intact.  Skin incisions appear to be well coapted with sutures intact. No sign of infectious process noted. No dehiscence. No active bleeding noted. Moderate edema noted to the surgical extremity.    Assessment: 1. s/p fibular sesamoidectomy left. DOS: 09/05/2020   Plan of Care:  1. Patient was evaluated.  2.  Sutures removed today. 3.  Patient may discontinue postsurgical shoe 4.  Slowly increase activity to full activity no restrictions 5.  Return to clinic as needed   Edrick Kins, DPM [facility]  Dr. Edrick Kins, DPM    [address], [place] [address]                Office [phone]  Fax [phone]

## 2020-10-06 IMAGING — US US THYROID
1 series · 13 of 25 positions shown · non-contrast
Comparison: None.

CLINICAL DATA: Multinodular goiter

EXAM:
THYROID ULTRASOUND
TECHNIQUE: Ultrasound examination of the thyroid gland and adjacent soft
tissues was performed.

[Series 1: us thyroid · 0.05mm/px · 13 of 56 slices shown]
[im 1/56]
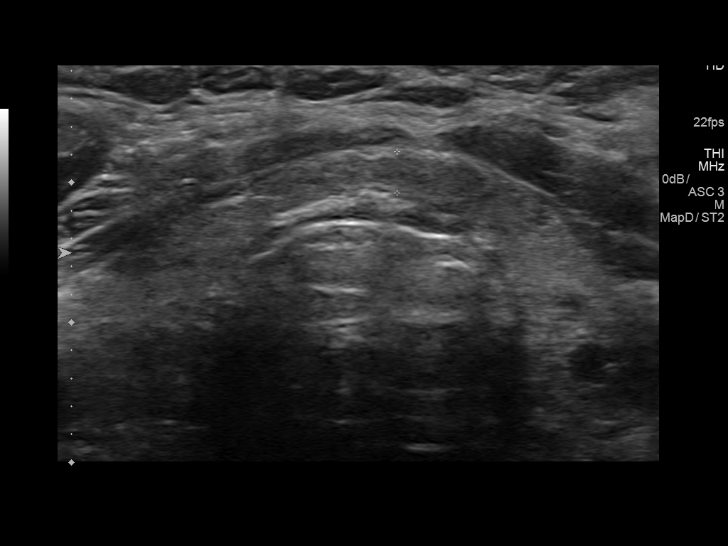
[im 5/56]
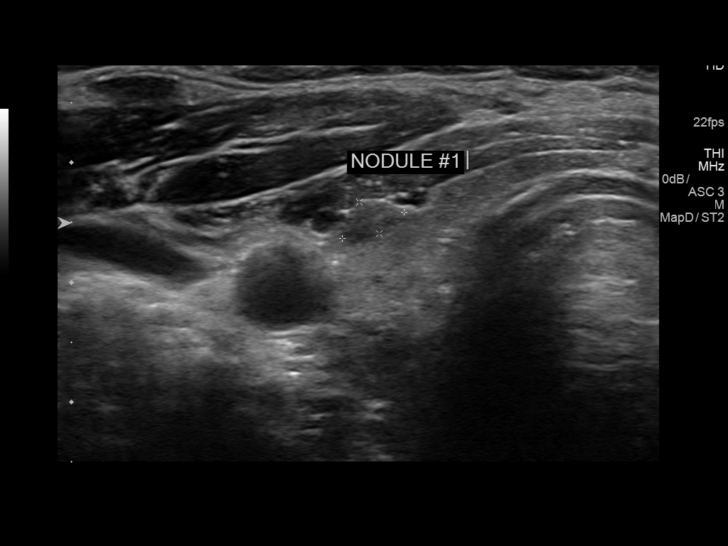
[im 10/56]
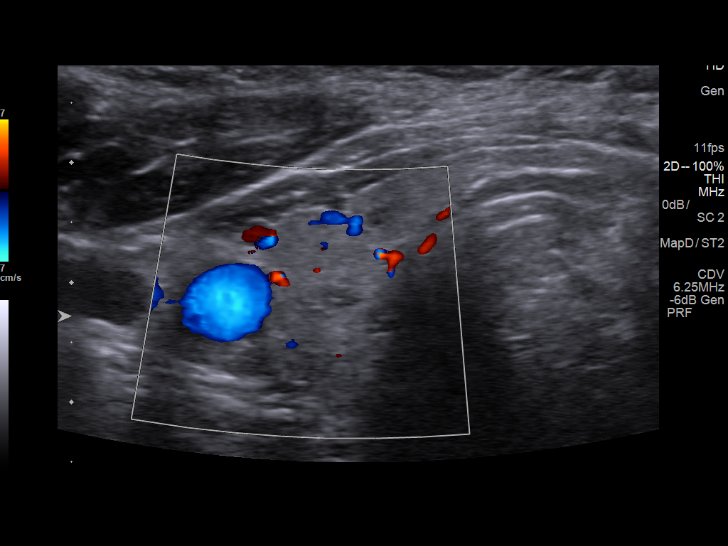
[im 14/56]
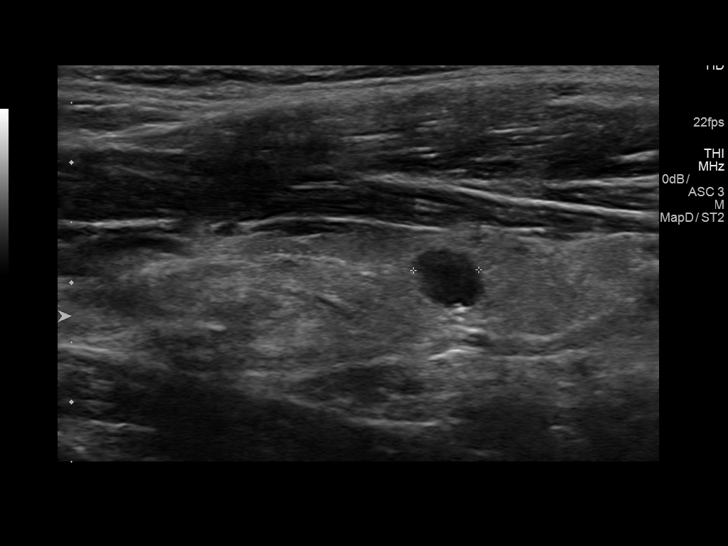
[im 19/56]
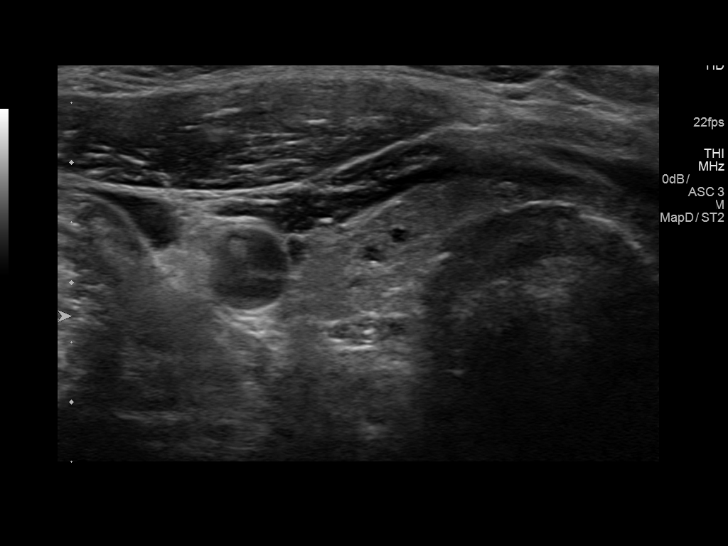
[im 23/56]
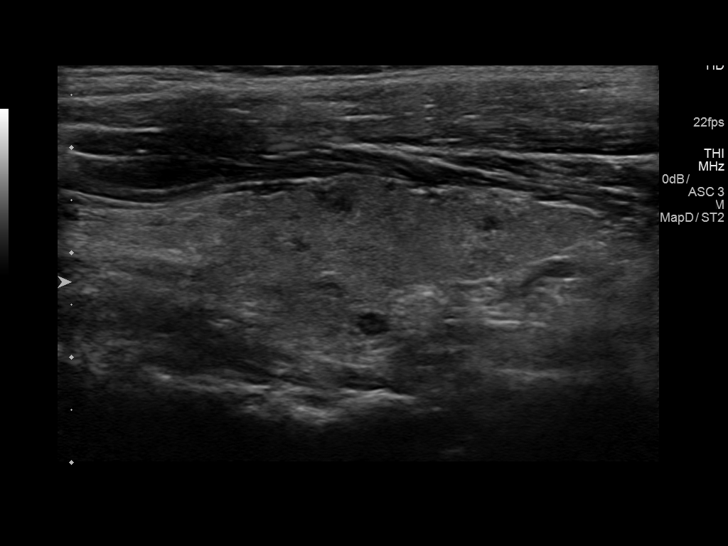
[im 28/56]
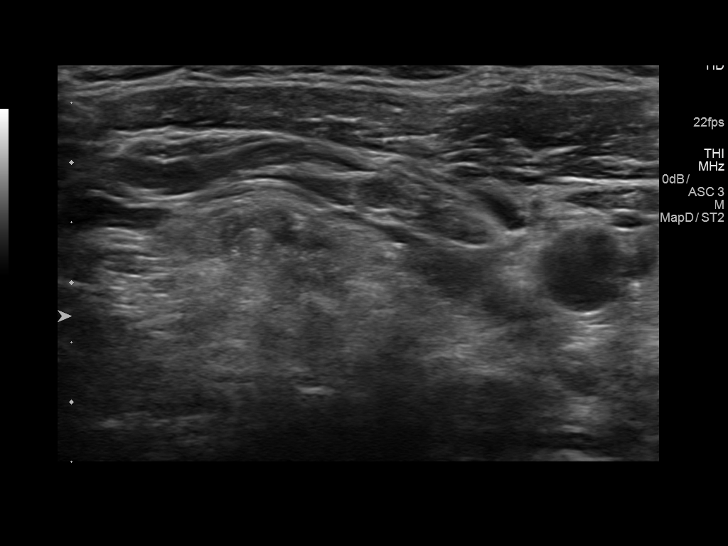
[im 33/56]
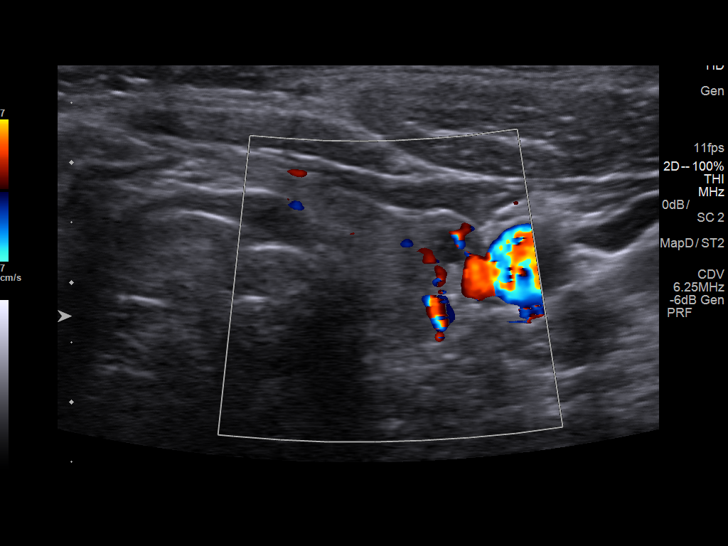
[im 37/56]
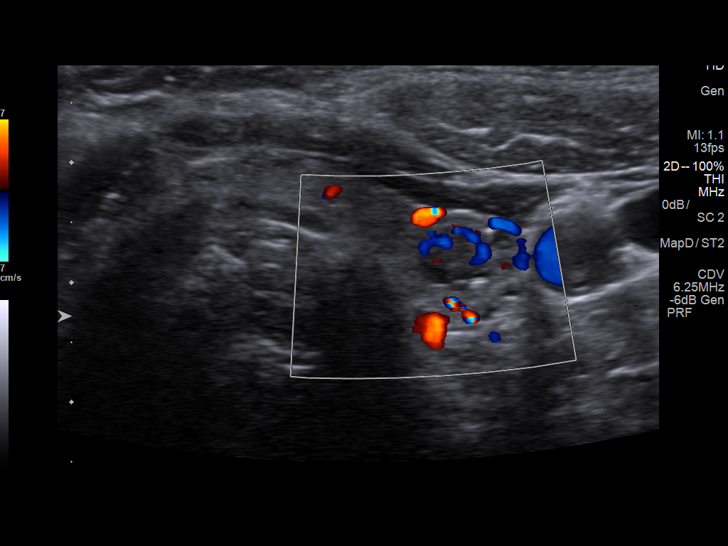
[im 42/56]
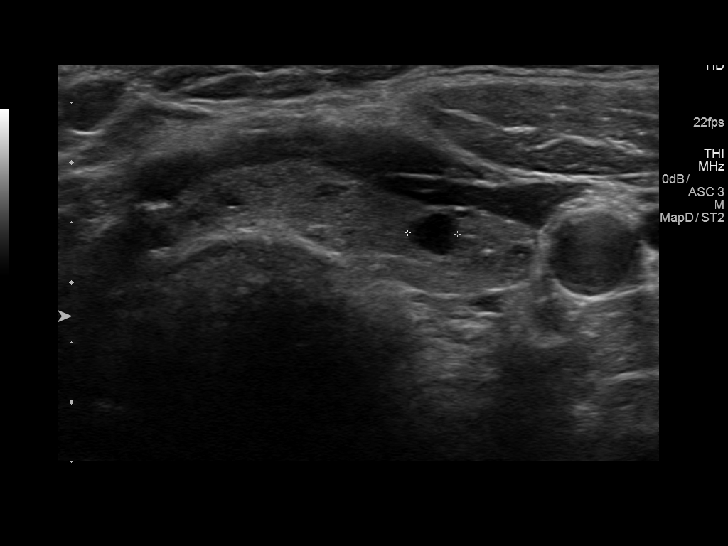
[im 46/56]
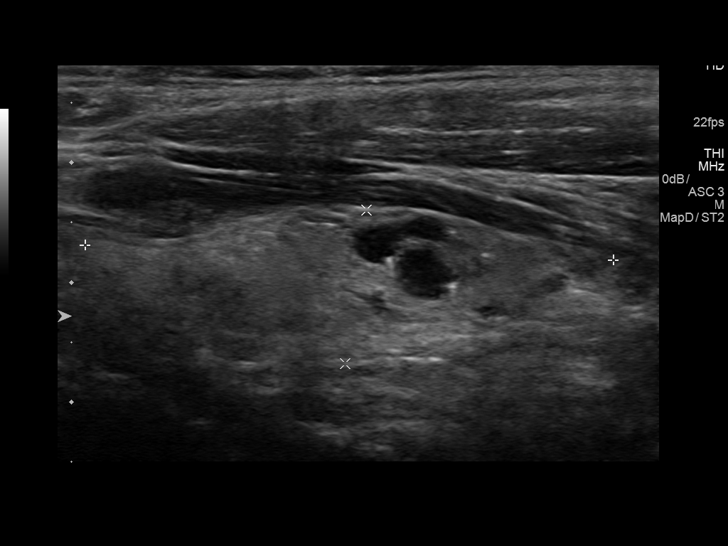
[im 51/56]
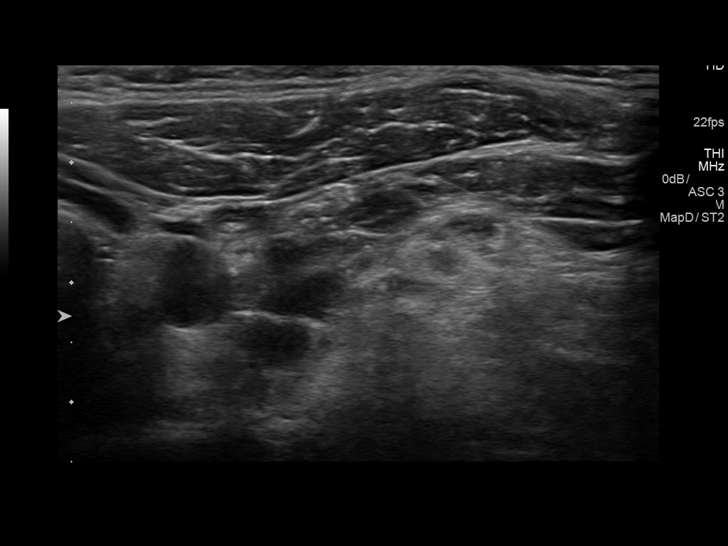
[im 56/56]
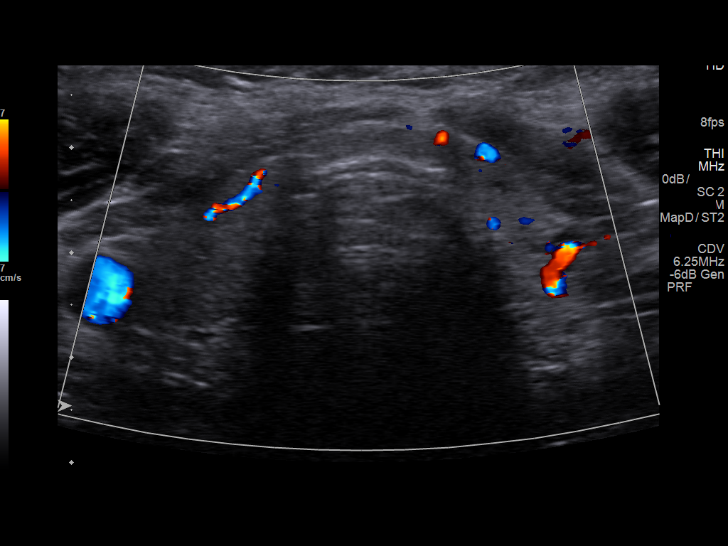

[13 of 25 positions shown; findings below may reference images not displayed]

FINDINGS: Parenchymal Echotexture: Moderately heterogenous

Isthmus: 0.3 cm thickness

Right lobe: 4.6 x 1.6 x 1.2 cm

Left lobe: 4.4 x 1.3 x 1.3 cm

_________________________________________________________

Estimated total number of nodules >/= 1 cm: 0

Number of spongiform nodules >/=  2 cm not described below (TR1): 0

Number of mixed cystic and solid nodules >/= 1.5 cm not described
below (TR2): 0

_________________________________________________________

0.6 cm hypoechoic nodule without calcifications, superior right;
This nodule does NOT meet TI-RADS criteria for biopsy or dedicated
follow-up.

0.7 cm hypoechoic nodule with peripheral calcification, inferior
left; This nodule does NOT meet TI-RADS criteria for biopsy or
dedicated follow-up.

Additional smaller cystic/hypoechoic lesions 5 mm or less
bilaterally.
IMPRESSION: 1. Normal-sized thyroid with scattered small cysts and nodules. None
meets criteria for biopsy or dedicated imaging follow-up.

The above is in keeping with the ACR TI-RADS recommendations - [HOSPITAL] 1665;[DATE].

## 2020-10-07 ENCOUNTER — Encounter: Payer: Medicare Other | Admitting: Podiatry

## 2020-10-11 ENCOUNTER — Other Ambulatory Visit: Payer: Self-pay | Admitting: Podiatry

## 2020-12-19 ENCOUNTER — Other Ambulatory Visit: Payer: Self-pay | Admitting: Podiatry

## 2020-12-19 NOTE — Telephone Encounter (Signed)
Please advise 

## 2021-04-07 ENCOUNTER — Other Ambulatory Visit: Payer: Self-pay | Admitting: Internal Medicine

## 2021-04-07 DIAGNOSIS — E042 Nontoxic multinodular goiter: Secondary | ICD-10-CM

## 2021-04-09 ENCOUNTER — Ambulatory Visit
Admission: RE | Admit: 2021-04-09 | Discharge: 2021-04-09 | Disposition: A | Discharge status: Home / Self Care | Payer: Medicare Other | Source: Ambulatory Visit | Attending: Internal Medicine | Admitting: Internal Medicine

## 2021-04-09 DIAGNOSIS — E042 Nontoxic multinodular goiter: Secondary | ICD-10-CM

## 2021-08-09 IMAGING — MR MR FOOT*L* W/O CM
5 series · 34 of 40 positions shown · non-contrast
Comparison: None.

CLINICAL DATA: Chronic foot pain.

EXAM:
MRI OF THE LEFT FOOT WITHOUT CONTRAST
TECHNIQUE: Multiplanar, multisequence MR imaging of the left foot was
performed. No intravenous contrast was administered.

[Series 5: T1 · coronal · 4.0mm · 0.62mm/px · 9 of 39 slices shown (1 of 2)]
[im 1/39]
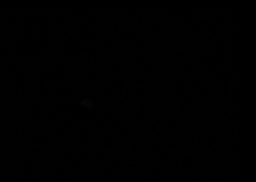
[im 5/39]
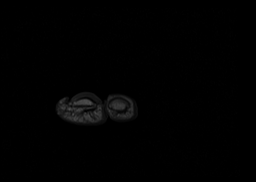
[im 10/39]
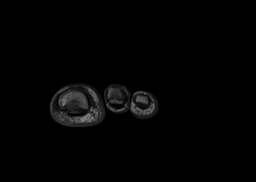
[im 15/39]
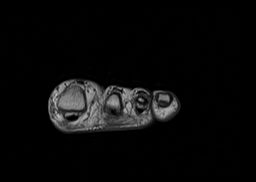
[im 20/39]
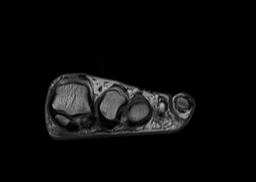
[im 24/39]
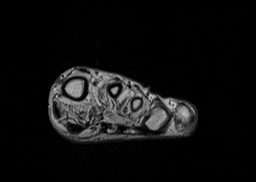
[im 29/39]
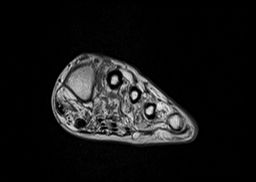
[im 34/39]
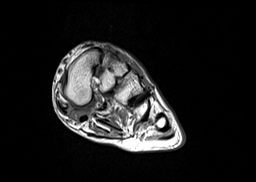
[im 39/39]
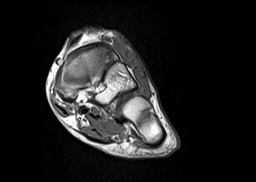

[Series 6: T2 fat-sat · coronal · 4.0mm · 0.31mm/px · 9 of 39 slices shown (1 of 2)]
[im 1/39]
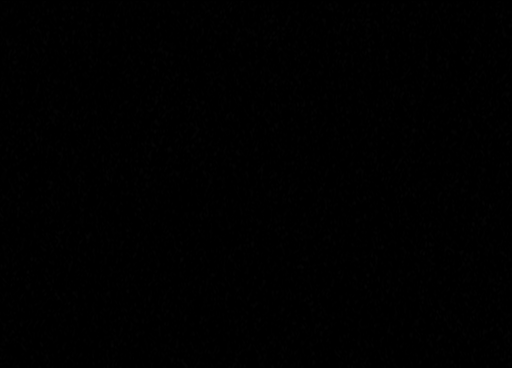
[im 5/39]
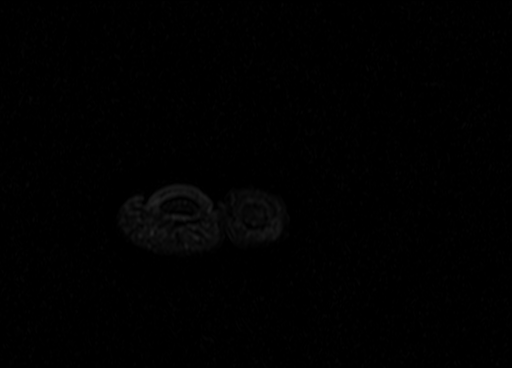
[im 10/39]
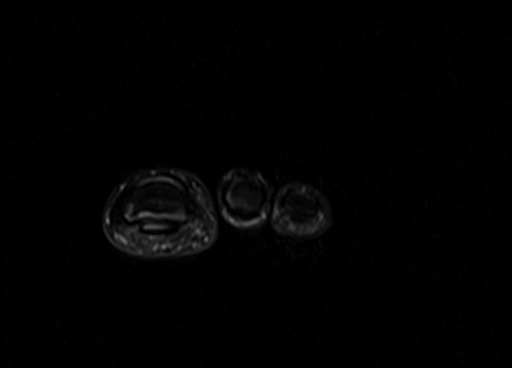
[im 15/39]
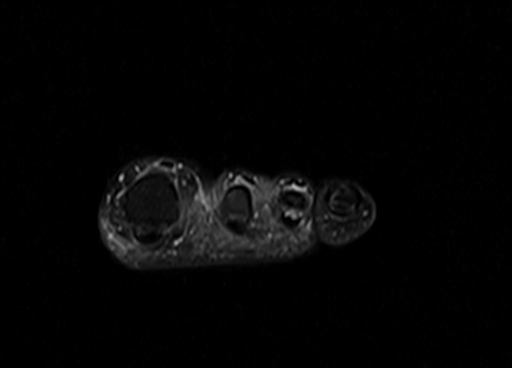
[im 20/39]
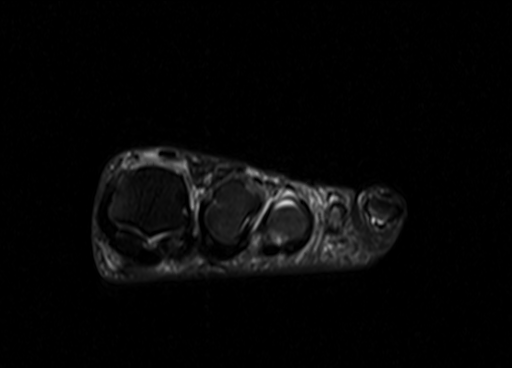
[im 24/39]
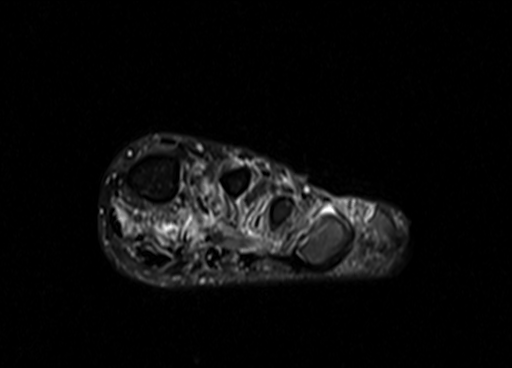
[im 29/39]
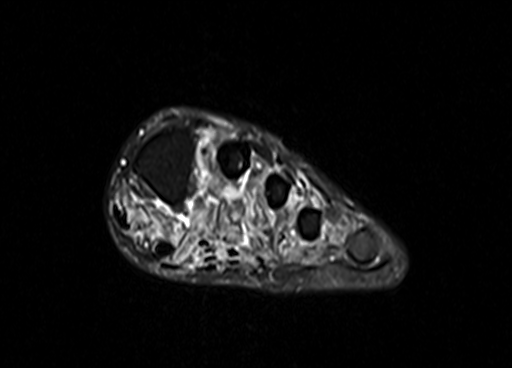
[im 34/39]
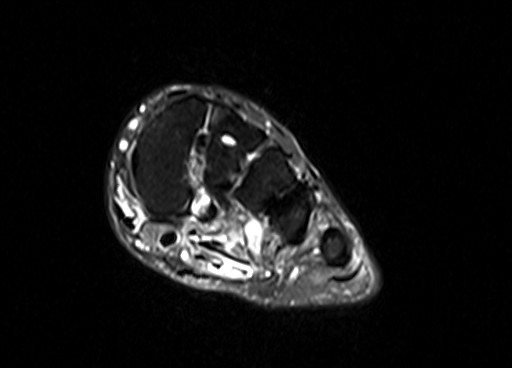
[im 39/39]
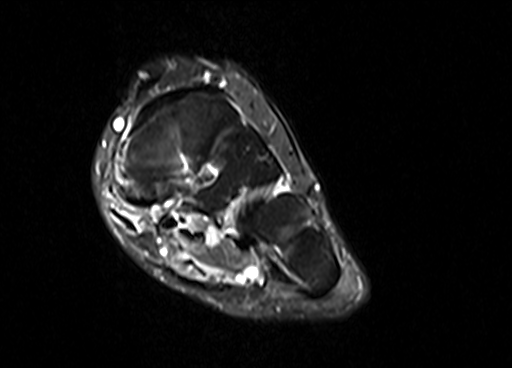

[Series 7: T1 · axial · 3.0mm · 0.39mm/px · 1 of 29 slices shown (2 of 2)]
[im 1/29]
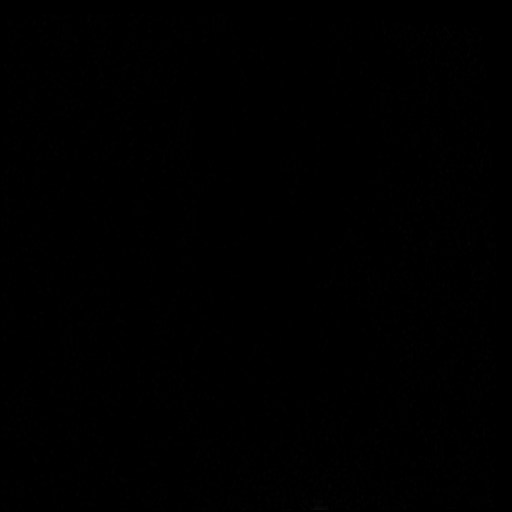

[Series 8: T2 fat-sat · axial · 3.0mm · 0.39mm/px · z∈[-94,-2]mm · 7 of 31 slices shown (2 of 2)]
[im 1/31]
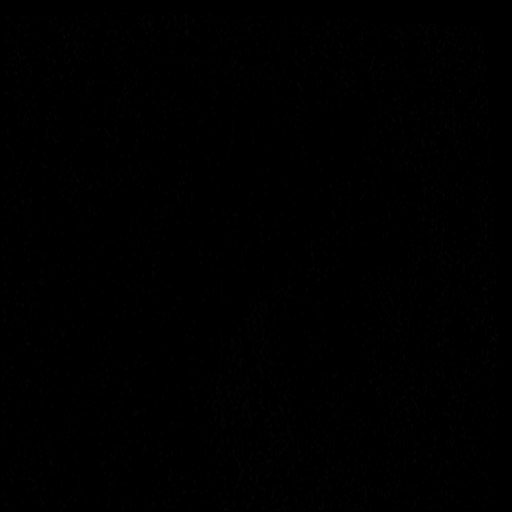
[im 6/31]
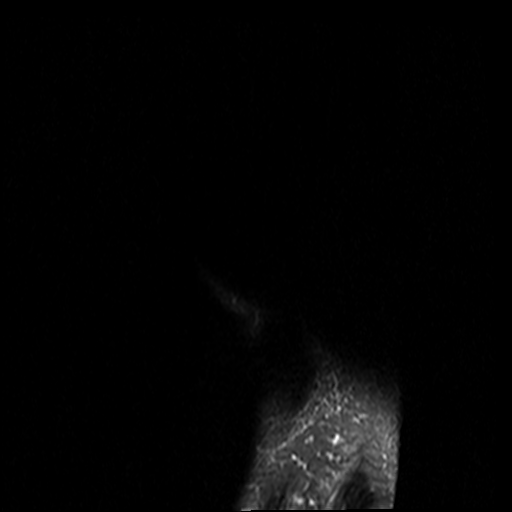
[im 11/31]
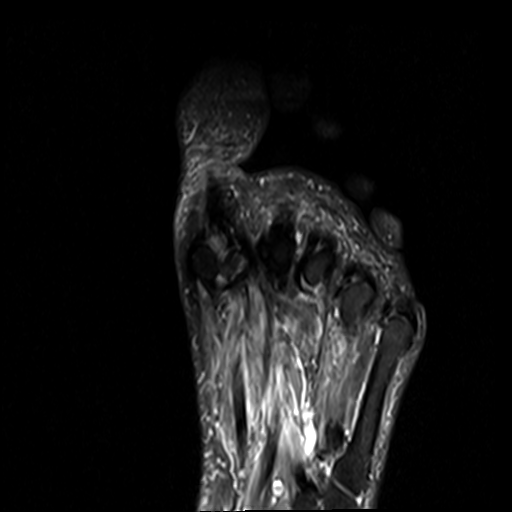
[im 16/31]
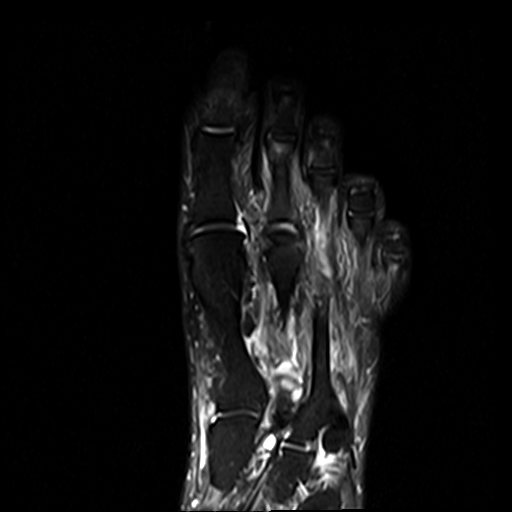
[im 21/31]
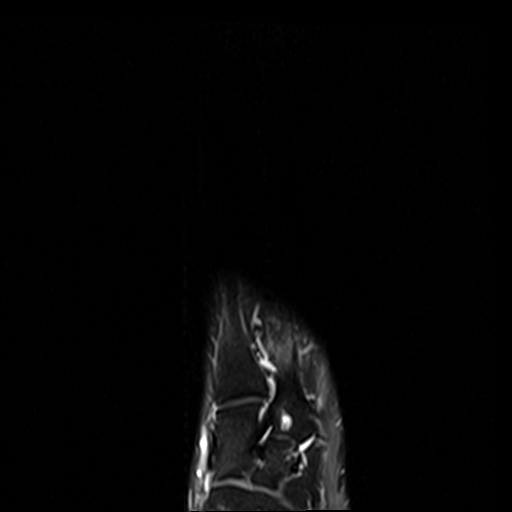
[im 26/31]
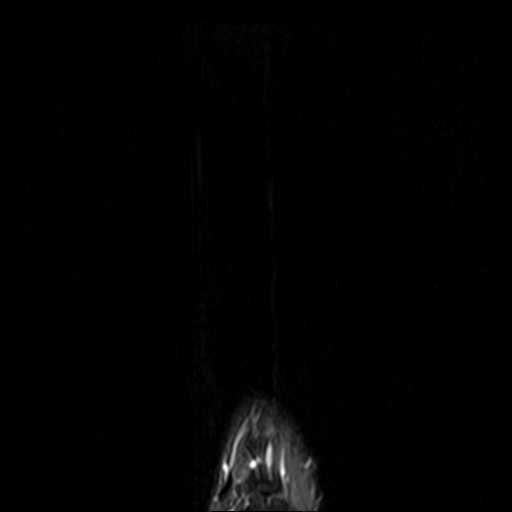
[im 31/31]
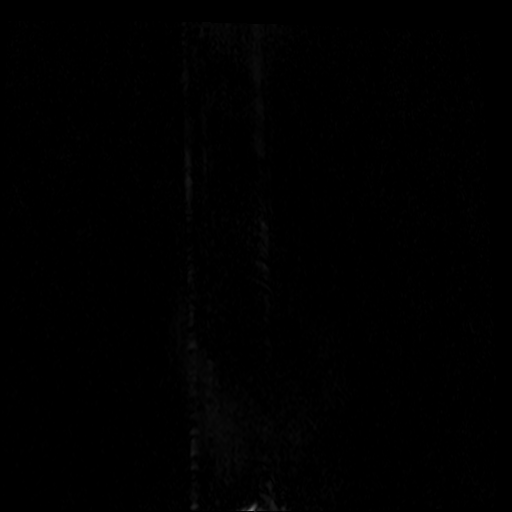

[Series 9: PD fat-sat · sagittal · 3.0mm · 0.74mm/px · 8 of 34 slices shown]
[im 1/34]
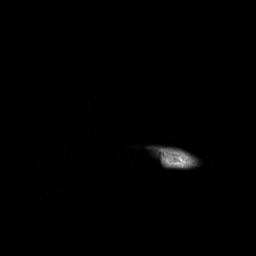
[im 5/34]
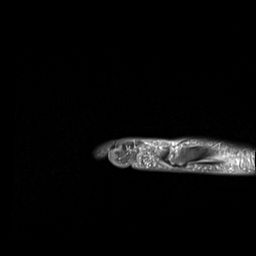
[im 10/34]
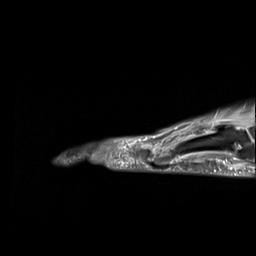
[im 15/34]
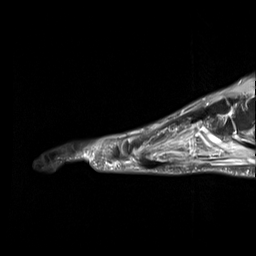
[im 19/34]
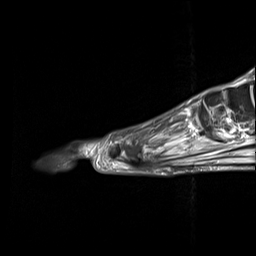
[im 24/34]
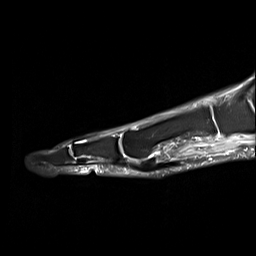
[im 29/34]
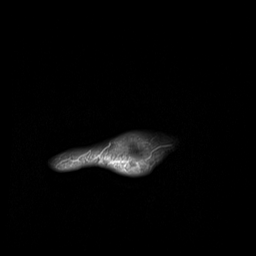
[im 34/34]
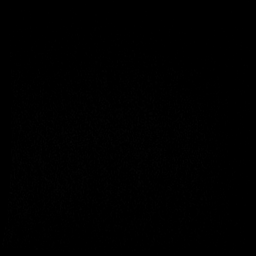

[34 of 40 positions shown; findings below may reference images not displayed]

FINDINGS: Bones/Joint/Cartilage

Severe bone marrow edema in the lateral hallux sesamoid as can be
seen with sesamoiditis with a possible subtle nondisplaced fracture
peripherally along the lateral aspect. No other fracture or
dislocation. Normal alignment. No joint effusion.

Mild osteoarthritis of the second TMT joint. Moderate osteoarthritis
of the first MTP joint.

Ligaments

Collateral ligaments are intact. Lisfranc ligament is intact.

Muscles and Tendons
Flexor, peroneal and extensor compartment tendons are intact.
Generalized muscle atrophy of the plantar musculature. No
intramuscular fluid collection.

Soft tissue
No fluid collection or hematoma. No soft tissue mass.
IMPRESSION: 1. Severe bone marrow edema in the lateral hallux sesamoid as can be
seen with sesamoiditis with a possible subtle nondisplaced fracture
peripherally along the lateral aspect.
2. Moderate osteoarthritis of the first MTP joint.
3. Mild osteoarthritis of the second TMT joint.

## 2022-07-14 IMAGING — US US THYROID
1 series · 13 of 25 positions shown · non-contrast
Comparison: 05/09/2026

CLINICAL DATA: Multinodular goiter

EXAM:
THYROID ULTRASOUND
TECHNIQUE: Ultrasound examination of the thyroid gland and adjacent soft
tissues was performed.

[Series 1: us thyroid · 0.07mm/px · 13 of 45 slices shown]
[im 1/45]
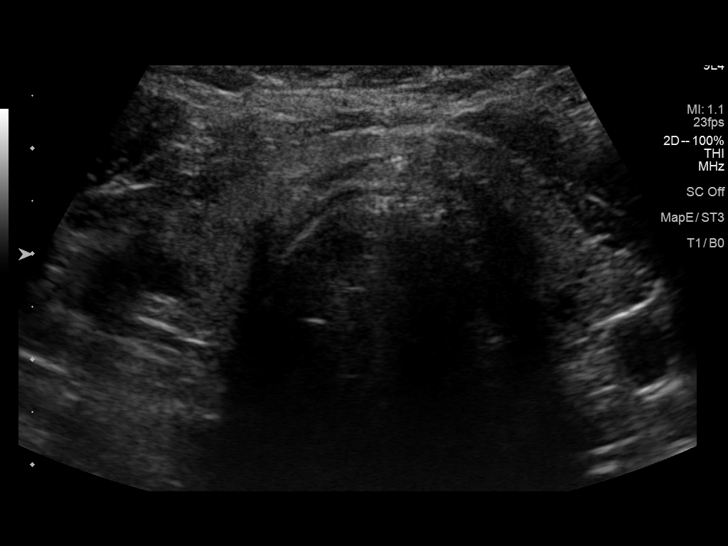
[im 4/45]
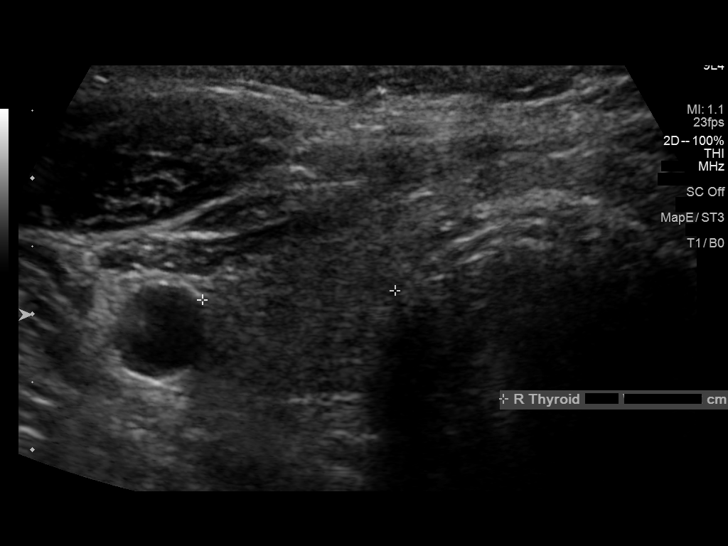
[im 8/45]
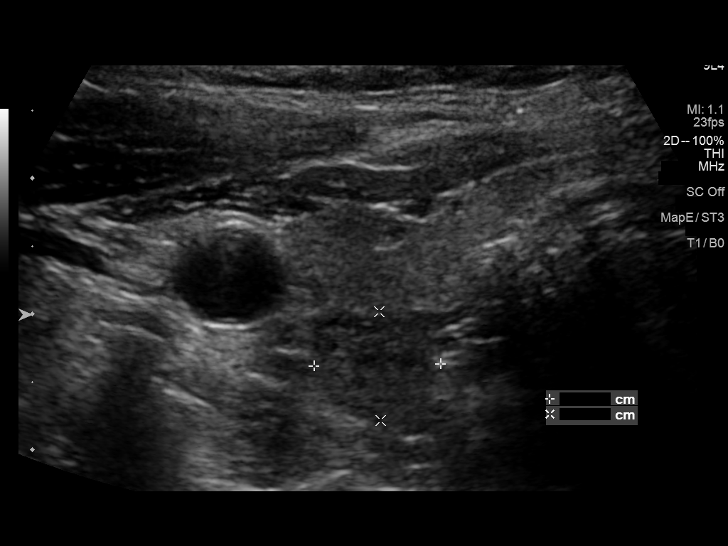
[im 12/45]
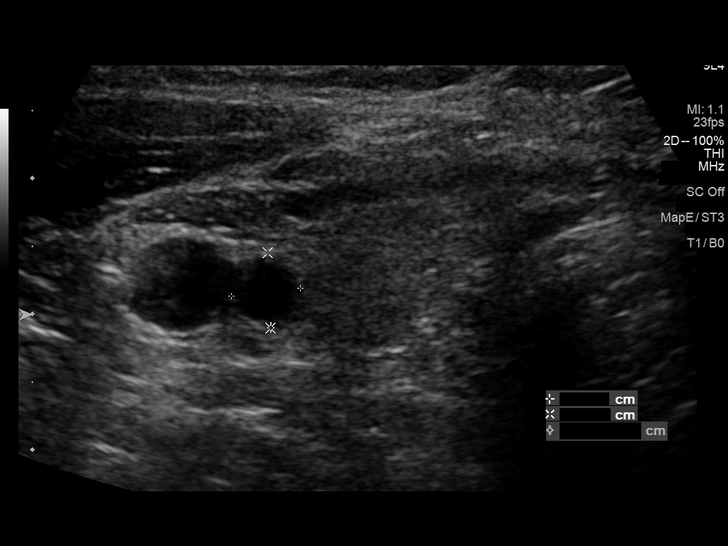
[im 15/45]
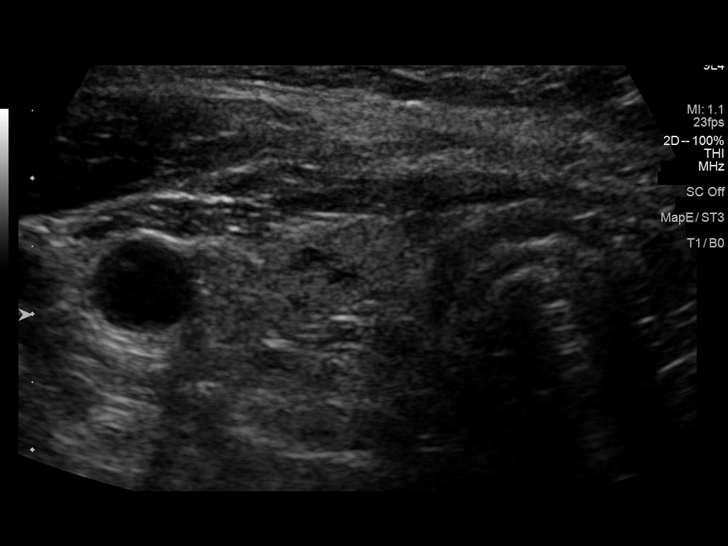
[im 19/45]
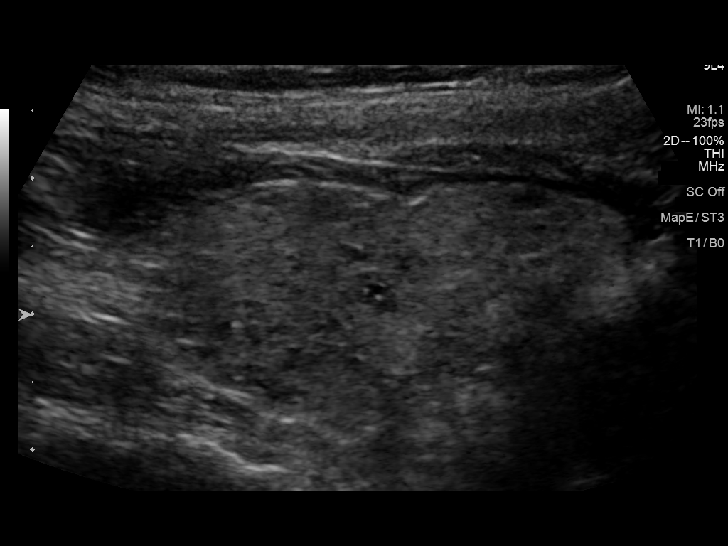
[im 23/45]
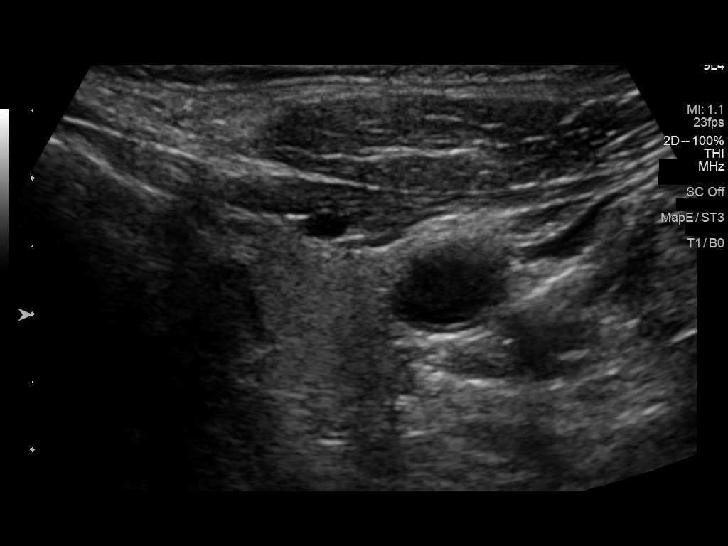
[im 26/45]
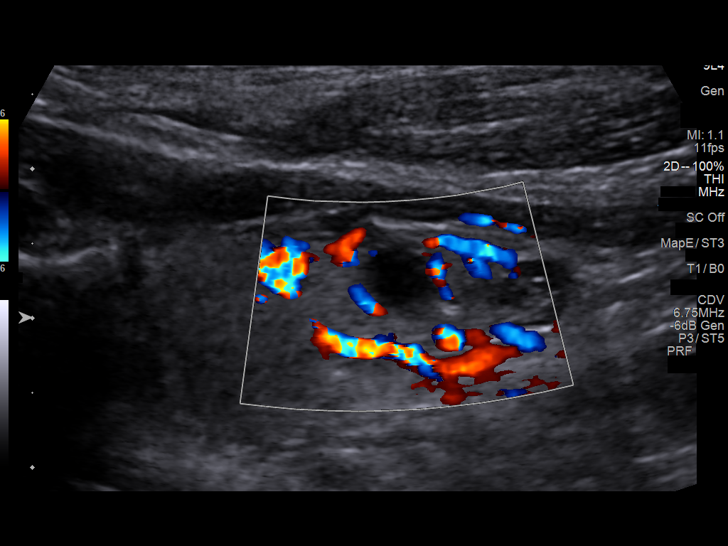
[im 30/45]
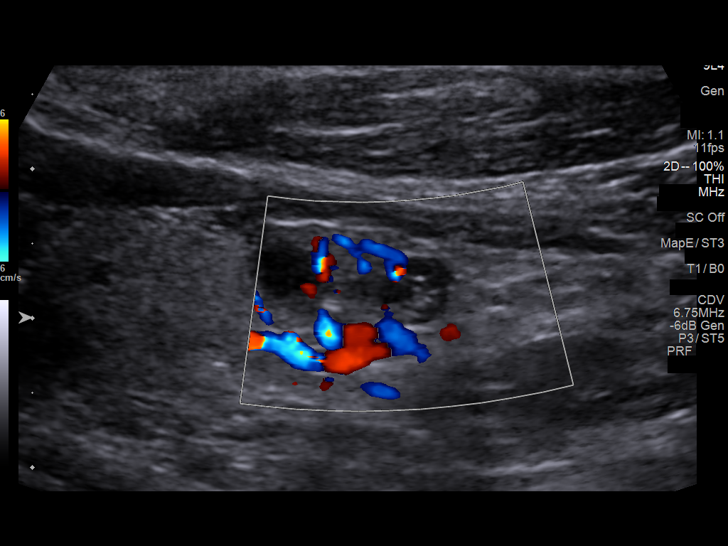
[im 34/45]
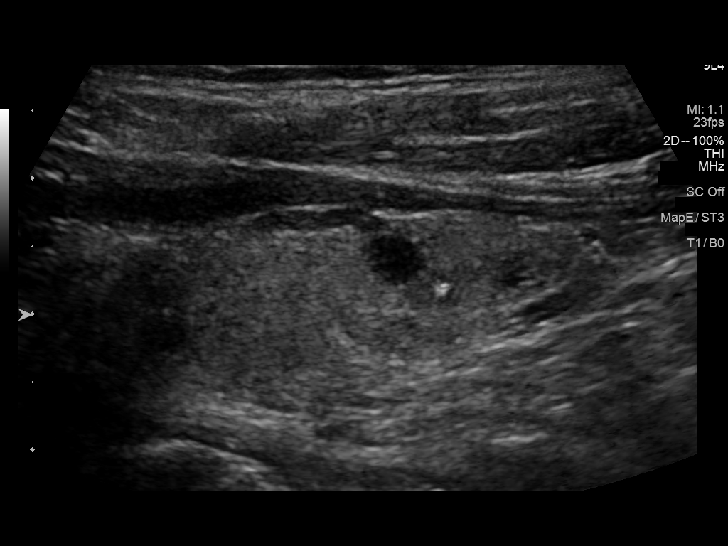
[im 37/45]
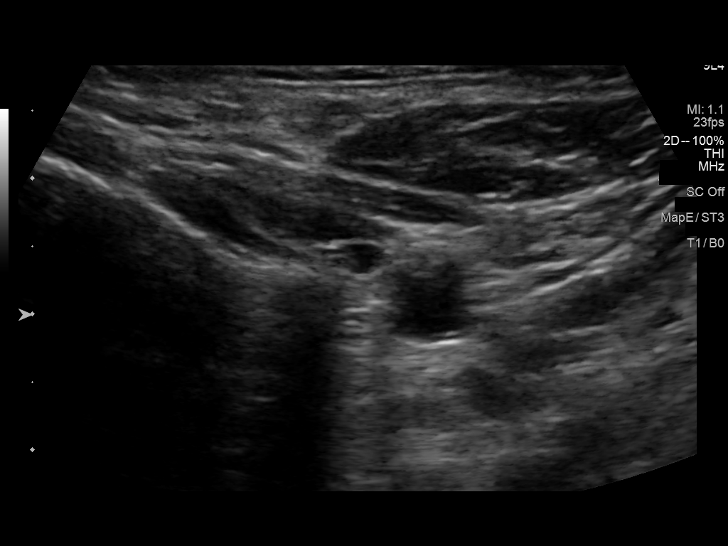
[im 41/45]
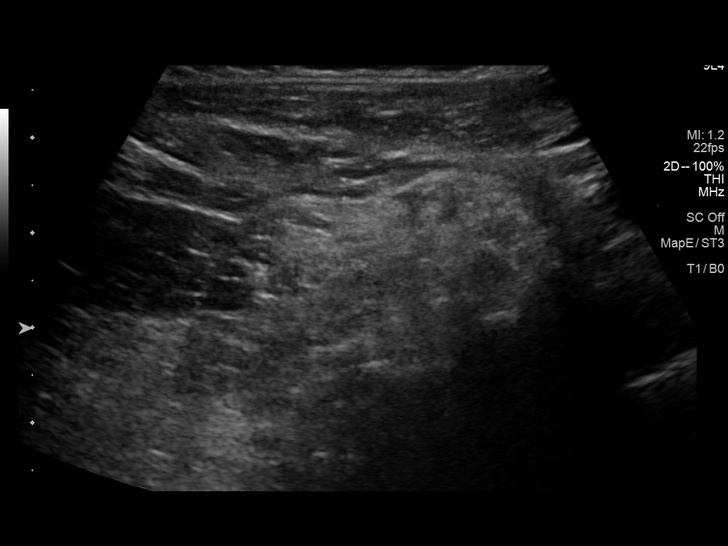
[im 45/45]
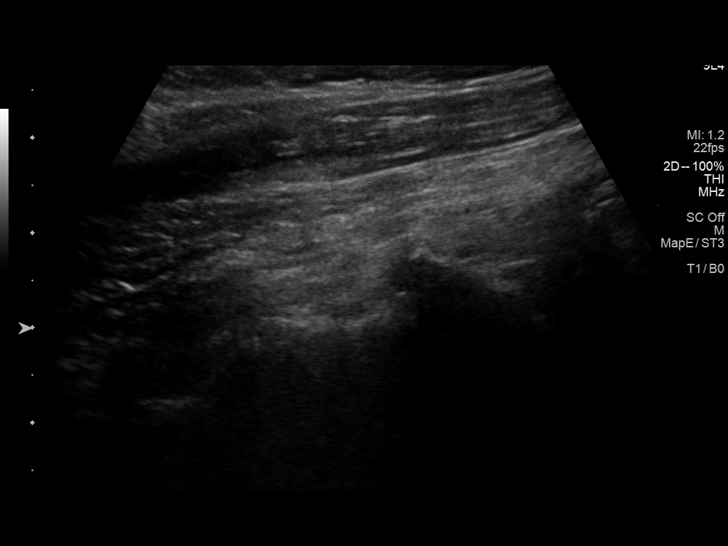

[13 of 25 positions shown; findings below may reference images not displayed]

FINDINGS: Parenchymal Echotexture: Mildly heterogeneous

Isthmus: 0.4 cm

Right lobe: 4.3 x 1.8 x 1.4 cm

Left lobe: 3.7 x 1.5 x 1.3 cm

_________________________________________________________

Estimated total number of nodules >/= 1 cm: 1

Number of spongiform nodules >/=  2 cm not described below (TR1): 0

Number of mixed cystic and solid nodules >/= 1.5 cm not described
below (TR2): 0

_________________________________________________________

Nodule # 1:

Location: Right; mid

Maximum size: 1.2 cm; Other 2 dimensions: 0.9 x 0.8 cm

Composition: solid/almost completely solid (2)

Echogenicity: isoechoic (1)

Shape: not taller-than-wide (0)

Margins: smooth (0)

Echogenic foci: none (0)

ACR TI-RADS total points: 3.

ACR TI-RADS risk category: TR3 (3 points).

ACR TI-RADS recommendations:

Given size (<1.4 cm) and appearance, this nodule does NOT meet
TI-RADS criteria for biopsy or dedicated follow-up.

_________________________________________________________

Nodule 2: 0.8 cm cystic nodule inferior right lobe does not meet
criteria for imaging surveillance or FNA.
_________________________________________________________

Nodule 3: 0.6 cm cystic nodule in the mid left thyroid lobe does not
meet criteria for imaging surveillance or FNA.

_________________________________________________________

Nodule 4: 0.7 cm predominantly cystic left inferior thyroid nodule
does not meet criteria for imaging surveillance or FNA.

_________________________________________________________

Nodule 5: 0.6 cm cystic nodule in the inferior left thyroid lobe
does not meet criteria for imaging surveillance or FNA.
IMPRESSION: Multiple bilateral thyroid nodules, which do not meet criteria for
FNA or imaging follow-up.

The above is in keeping with the ACR TI-RADS recommendations - [HOSPITAL] 1375;[DATE].

## 2024-01-23 NOTE — Progress Notes (Unsigned)
 [name] - 68 y.o. male MRN [id number]  Date of birth: 11-01-1955  Office Visit Note: Visit Date: 01/24/2024 PCP: Bradley Planas, MD Referred by: Bradley Planas, MD  Subjective: No chief complaint on file.  HPI: Bradley Lambert is a pleasant 68 y.o. male who presents today for evaluation of ongoing left wrist pain.  He was seen previously by an alternate surgeon who had discussed ongoing scapholunate widening and possible salvage procedures including proximal carpectomy versus scaphoidectomy and 4 corner fusion.  He did have an injury over 1 year prior with likely injury to the scapholunate ligament.  Pain has persisted since that time, has ongoing discomfort with heavy grip and loading of the left wrist.  He is right-hand dominant, overall healthy and active at baseline.  Has undergone prior injection to the left wrist radiocarpal joint with minimal relief of symptoms.  Pertinent ROS were reviewed with the patient and found to be negative unless otherwise specified above in HPI.   Visit Reason: left wrist Duration of symptoms: 1+ year Hand dominance: right Occupation: retired Diabetic: No Smoking: No Heart/Lung History:none Blood Thinners:  none  Prior Testing/EMG:xrays Injections (Date): 04/15/23 Treatments: injection Prior Surgery: none    Assessment & Plan: Visit Diagnoses:  1. Scapholunate dissociation of left wrist   2. Pain in left wrist     Plan: Repeat x-rays were taken today which show significant diastases of the scapholunate interval compatible with previous scapholunate injury and progressive arthritic change.  We discussed the chronicity of this problem and the ongoing diastases in comparison to his prior films.  This correlates with his clinical examination which shows significant pain at the scapholunate interval with notable clunk and instability with [name] shift testing.  At this juncture given that his symptoms continue to progress and are refractory to  conservative care in the form of activity modification, bracing and previous injection, patient is indicated for salvage procedure for the ongoing scapholunate ligament insufficiency and progressive arthritis.  I am in agreement with his previous evaluation which discussed the possibility of proximal carpectomy with potential allograft spacer versus scaphoidectomy and 4 corner fusion.  Both treatment options were discussed in detail today from a surgical and postsurgical treatment standpoint.  Risks and benefits of both procedures were discussed in detail as well.  Understanding his options, patient would like to preserve as much motion at the left wrist as possible moving forward.  Understanding this, we will move forward with left wrist proximal row carpectomy and possible allograft spacer based on the appearance of the capitate intraoperatively.  I also discussed the PIN neurectomy that we can perform as part of this procedure as well for postoperative pain control.  Patient expressed understanding, we can move forward with surgical scheduling.  Follow-up: No follow-ups on file.   Meds & Orders: No orders of the defined types were placed in this encounter.   Orders Placed This Encounter  Procedures   XR Wrist Complete Left     Procedures: No procedures performed      Clinical History: No specialty comments available.  He reports that he has never smoked. He has never used smokeless tobacco. No results for input(s): HGBA1C, LABURIC in the last 8760 hours.  Objective:   Vital Signs: There were no vitals taken for this visit.  Physical Exam  Gen: Well-appearing, in no acute distress; non-toxic CV: Regular Rate. Well-perfused. Warm.  Resp: Breathing unlabored on room air; no wheezing. Psych: Fluid speech in conversation; appropriate affect; normal  thought process  Ortho Exam PHYSICAL EXAM:  General: Patient is well appearing and in no distress.  Skin and Muscle: No  significant skin changes are apparent to hands.  Muscle bulk and contour normal, no signs of atrophy.     Range of Motion and Palpation Tests: Mobility is full about the elbows with flexion and extension.  Forearm supination and pronation are 85/85 bilaterally.  Wrist flexion/extension is 75/65 bilaterally.  Digital flexion and extension are full.  Thumb opposition is full to the base of the small fingers bilaterally.    No cords or nodules are palpated.  No triggering is observed.    Significant tenderness over the left wrist scapholunate interval.  Notable pain and clunk with [facility] shift testing.  Watson shift testing is negative on the right wrist.    Minimal tenderness over the thumb CMC articulations is observed. Ulnar impingement test is negative bilaterally.  Neurologic, Vascular, Motor: Sensation is intact to light touch in the median/radial/ulnar distributions. Fingers pink and well perfused.  Capillary refill is brisk.      No results found for: HGBA1C    Imaging: XR Wrist Complete Left Result Date: 01/24/2024 X-rays of the left wrist demonstrate significant diastases between the scaphoid and lunate consistent with underlying scapholunate ligament insufficiency.  There is mild irregularity to the proximal capitate service as well.   Past Medical/Family/Surgical/Social History: Medications & Allergies reviewed per EMR, new medications updated. Patient Active Problem List   Diagnosis Date Noted   Visual changes 04/04/2018   TIA (transient ischemic attack) 04/04/2018   Past Medical History:  Diagnosis Date   Anxiety    Arthritis    back-arthritis   BPH (benign prostatic hyperplasia)    Chronic low back pain    GERD (gastroesophageal reflux disease)    History of melanoma    Hypertension    Insomnia    Migraine    Prostate cancer (HCC) 12-01-12   bx.x2- last- 6'14 -dx. prostate cancer   Vision disturbance    Wears glasses    Family History  Problem Relation  Age of Onset   Pneumonia Mother    Esophageal cancer Father    Thyroid  disease Sister    Cancer Brother        eye   Heart attack Brother    Diabetes Brother    Hypertension Brother    CAD Brother    Diverticulitis Maternal Grandfather    Crohn's disease Maternal Aunt    Heart disease Maternal Grandmother    Past Surgical History:  Procedure Laterality Date   COLONOSCOPY     CYST EXCISION     forehead- sebaceous   DERMOID CYST  EXCISION     x3 hands   EYE SURGERY     radiocaratotomyx4   FINGER GANGLION CYST EXCISION     HERNIA REPAIR     hernia x3   MASS EXCISION Right 04/17/2013   Procedure: RIGHT INDEX FINGER CYST REMOVAL;  Surgeon: Alm DELENA Hummer, MD;  Location: [facility];  Service: Orthopedics;  Laterality: Right;   MELANOMA EXCISION  12-01-12   mid.anterior chest -melanoma excised -11 yrs ago   PAROTIDECTOMY  08/14/2011   Procedure: PAROTIDECTOMY;  Surgeon: Lonni FORBES Angle, MD;  Location: [facility];  Service: ENT;  Laterality: Left;  EXCISION PAROTID MASS WITH FACIAL NERVE DISSECTION   ROBOT ASSISTED LAPAROSCOPIC RADICAL PROSTATECTOMY N/A 12/08/2012   Procedure: ROBOTIC ASSISTED LAPAROSCOPIC RADICAL PROSTATECTOMY LEVEL 3;  Surgeon: Noretta Ferrara, MD;  Location: [facility];  Service: Urology;  Laterality: N/A;   ROTATOR CUFF REPAIR Right    TONSILLECTOMY     Social History   Occupational History   Occupation: retired  Tobacco Use   Smoking status: Never   Smokeless tobacco: Never  Substance and Sexual Activity   Alcohol use: Not Currently   Drug use: No   Sexual activity: Yes    Tonianne Fine Estela) Arlinda, M.D. [facility], Hand Surgery

## 2024-01-24 ENCOUNTER — Other Ambulatory Visit: Payer: Self-pay

## 2024-01-24 ENCOUNTER — Ambulatory Visit: Admitting: Orthopedic Surgery

## 2024-01-24 DIAGNOSIS — M25532 Pain in left wrist: Secondary | ICD-10-CM

## 2024-01-24 DIAGNOSIS — M25332 Other instability, left wrist: Secondary | ICD-10-CM | POA: Diagnosis not present

## 2024-02-05 ENCOUNTER — Encounter: Payer: Self-pay | Admitting: Orthopedic Surgery

## 2024-02-08 NOTE — Telephone Encounter (Signed)
 I spoke with the patient and scheduled his surgery for 03/10/24.

## 2024-02-10 ENCOUNTER — Other Ambulatory Visit: Payer: Self-pay

## 2024-02-10 DIAGNOSIS — M25332 Other instability, left wrist: Secondary | ICD-10-CM

## 2024-03-03 ENCOUNTER — Encounter (HOSPITAL_BASED_OUTPATIENT_CLINIC_OR_DEPARTMENT_OTHER): Payer: Self-pay | Admitting: Orthopedic Surgery

## 2024-03-07 ENCOUNTER — Encounter (HOSPITAL_BASED_OUTPATIENT_CLINIC_OR_DEPARTMENT_OTHER)
Admission: RE | Admit: 2024-03-07 | Discharge: 2024-03-07 | Disposition: A | Source: Ambulatory Visit | Attending: Orthopedic Surgery

## 2024-03-07 DIAGNOSIS — Z01810 Encounter for preprocedural cardiovascular examination: Secondary | ICD-10-CM | POA: Diagnosis not present

## 2024-03-08 NOTE — Progress Notes (Signed)
 Reviewed EKG with Dr Paul. OK to proceed as planned

## 2024-03-10 ENCOUNTER — Ambulatory Visit (HOSPITAL_BASED_OUTPATIENT_CLINIC_OR_DEPARTMENT_OTHER)

## 2024-03-10 ENCOUNTER — Ambulatory Visit (HOSPITAL_BASED_OUTPATIENT_CLINIC_OR_DEPARTMENT_OTHER)
Admission: RE | Admit: 2024-03-10 | Discharge: 2024-03-10 | Disposition: A | Discharge status: Home / Self Care | Attending: Orthopedic Surgery | Admitting: Orthopedic Surgery

## 2024-03-10 ENCOUNTER — Encounter (HOSPITAL_BASED_OUTPATIENT_CLINIC_OR_DEPARTMENT_OTHER)
Admission: RE | Disposition: A | Discharge status: Home / Self Care | Payer: Self-pay | Source: Home / Self Care | Attending: Orthopedic Surgery

## 2024-03-10 ENCOUNTER — Other Ambulatory Visit: Payer: Self-pay

## 2024-03-10 ENCOUNTER — Ambulatory Visit (HOSPITAL_BASED_OUTPATIENT_CLINIC_OR_DEPARTMENT_OTHER): Admitting: Anesthesiology

## 2024-03-10 ENCOUNTER — Encounter (HOSPITAL_BASED_OUTPATIENT_CLINIC_OR_DEPARTMENT_OTHER): Payer: Self-pay | Admitting: Orthopedic Surgery

## 2024-03-10 DIAGNOSIS — M19032 Primary osteoarthritis, left wrist: Secondary | ICD-10-CM | POA: Diagnosis present

## 2024-03-10 DIAGNOSIS — F419 Anxiety disorder, unspecified: Secondary | ICD-10-CM | POA: Insufficient documentation

## 2024-03-10 DIAGNOSIS — K219 Gastro-esophageal reflux disease without esophagitis: Secondary | ICD-10-CM | POA: Insufficient documentation

## 2024-03-10 DIAGNOSIS — I10 Essential (primary) hypertension: Secondary | ICD-10-CM | POA: Insufficient documentation

## 2024-03-10 DIAGNOSIS — M25832 Other specified joint disorders, left wrist: Secondary | ICD-10-CM | POA: Diagnosis not present

## 2024-03-10 DIAGNOSIS — Z01818 Encounter for other preprocedural examination: Secondary | ICD-10-CM

## 2024-03-10 DIAGNOSIS — S63392A Traumatic rupture of other ligament of left wrist, initial encounter: Secondary | ICD-10-CM | POA: Diagnosis not present

## 2024-03-10 DIAGNOSIS — Z79899 Other long term (current) drug therapy: Secondary | ICD-10-CM | POA: Insufficient documentation

## 2024-03-10 DIAGNOSIS — Z8673 Personal history of transient ischemic attack (TIA), and cerebral infarction without residual deficits: Secondary | ICD-10-CM | POA: Diagnosis not present

## 2024-03-10 DIAGNOSIS — M19132 Post-traumatic osteoarthritis, left wrist: Secondary | ICD-10-CM | POA: Diagnosis not present

## 2024-03-10 DIAGNOSIS — M24232 Disorder of ligament, left wrist: Secondary | ICD-10-CM | POA: Diagnosis not present

## 2024-03-10 HISTORY — PX: CARPECTOMY: SHX5004

## 2024-03-10 MED ORDER — ACETAMINOPHEN 500 MG PO TABS
ORAL_TABLET | ORAL | Status: AC
  Filled 2024-03-10: qty 2

## 2024-03-10 MED ORDER — FENTANYL CITRATE (PF) 100 MCG/2ML IJ SOLN: 25.0000 ug | INTRAMUSCULAR | Status: DC | PRN

## 2024-03-10 MED ORDER — CEFAZOLIN SODIUM-DEXTROSE 2-4 GM/100ML-% IV SOLN
2.0000 g | INTRAVENOUS | Status: AC
  Administered 2024-03-10: 2 g via INTRAVENOUS

## 2024-03-10 MED ORDER — CLONIDINE HCL (ANALGESIA) 100 MCG/ML EP SOLN
EPIDURAL | Status: DC | PRN
  Administered 2024-03-10: 60 ug

## 2024-03-10 MED ORDER — CEFAZOLIN SODIUM-DEXTROSE 2-4 GM/100ML-% IV SOLN
INTRAVENOUS | Status: AC
  Filled 2024-03-10: qty 100

## 2024-03-10 MED ORDER — FENTANYL CITRATE (PF) 100 MCG/2ML IJ SOLN
100.0000 ug | Freq: Once | INTRAMUSCULAR | Status: AC
  Administered 2024-03-10: 50 ug via INTRAVENOUS

## 2024-03-10 MED ORDER — ACETAMINOPHEN 500 MG PO TABS
1000.0000 mg | ORAL_TABLET | Freq: Once | ORAL | Status: AC
  Administered 2024-03-10: 1000 mg via ORAL

## 2024-03-10 MED ORDER — MIDAZOLAM HCL 2 MG/2ML IJ SOLN
INTRAMUSCULAR | Status: AC
  Filled 2024-03-10: qty 2

## 2024-03-10 MED ORDER — DROPERIDOL 2.5 MG/ML IJ SOLN: 0.6250 mg | Freq: Once | INTRAMUSCULAR | Status: DC | PRN

## 2024-03-10 MED ORDER — OXYCODONE HCL 5 MG PO TABS: 5.0000 mg | ORAL_TABLET | Freq: Four times a day (QID) | ORAL | 0 refills | Status: AC | PRN

## 2024-03-10 MED ORDER — MIDAZOLAM HCL (PF) 2 MG/2ML IJ SOLN
2.0000 mg | Freq: Once | INTRAMUSCULAR | Status: AC
  Administered 2024-03-10: 1 mg via INTRAVENOUS

## 2024-03-10 MED ORDER — FENTANYL CITRATE (PF) 100 MCG/2ML IJ SOLN
INTRAMUSCULAR | Status: AC
  Filled 2024-03-10: qty 2

## 2024-03-10 MED ORDER — 0.9 % SODIUM CHLORIDE (POUR BTL) OPTIME
TOPICAL | Status: DC | PRN
  Administered 2024-03-10: 1000 mL

## 2024-03-10 MED ORDER — LACTATED RINGERS IV SOLN: INTRAVENOUS | Status: DC

## 2024-03-10 MED ORDER — PROPOFOL 10 MG/ML IV BOLUS
INTRAVENOUS | Status: DC | PRN
  Administered 2024-03-10: 200 ug/kg/min via INTRAVENOUS
  Administered 2024-03-10: 50 mg via INTRAVENOUS

## 2024-03-10 MED ORDER — DEXAMETHASONE SODIUM PHOSPHATE 4 MG/ML IJ SOLN
INTRAMUSCULAR | Status: DC | PRN
  Administered 2024-03-10: 5 mg via PERINEURAL

## 2024-03-10 MED ORDER — ROPIVACAINE HCL 5 MG/ML IJ SOLN
INTRAMUSCULAR | Status: DC | PRN
  Administered 2024-03-10: 35 mL via PERINEURAL

## 2024-03-10 NOTE — Op Note (Signed)
 NAME: Bradley Lambert MEDICAL RECORD NO: [id number] DATE OF BIRTH: 04/07/1955 FACILITY: [facility] LOCATION: [facility] PHYSICIAN: Maleak Brazzel, MD   OPERATIVE REPORT   DATE OF PROCEDURE: 03/10/24    PREOPERATIVE DIAGNOSIS:  Left wrist scapholunate disassociation with progressive arthritic changes  POSTOPERATIVE DIAGNOSIS:  Left wrist scapholunate disassociation with progressive arthritic changes  PROCEDURE:  Left wrist proximal row carpectomy with placement of interposition allograft spacer   SURGEON:  Gildardo Alderton, M.D.   ASSISTANT: Joesph Dinsmore, OPA   ANESTHESIA:  Regional with sedation   INTRAVENOUS FLUIDS:  Per anesthesia flow sheet.   ESTIMATED BLOOD LOSS:  Minimal.   COMPLICATIONS:  None.   SPECIMENS:  none   TOURNIQUET TIME:    Total Tourniquet Time Documented: Upper Arm (Left) - 61 minutes Total: Upper Arm (Left) - 61 minutes    DISPOSITION:  Stable to PACU.   INDICATIONS: 69 year old male with chronic scapholunate insufficiency with progressive pain at the left wrist.  Patient was seen in the outpatient setting after 1 year of nonoperative care failed to remedy his pain.  Patient was presenting with options for surgical intervention in the form of scapholunate ligament reconstruction, however notable arthritic changes were seen.  Patient was offered salvage procedure for the ongoing scapholunate ligament insufficiency and progressive arthritis.    Options of proximal carpectomy with potential allograft spacer versus scaphoidectomy and 4 corner fusion was discussed.  Both treatment options were discussed in detail from a surgical and postsurgical treatment standpoint.  Risks and benefits of both procedures were discussed in detail as well.   Understanding his options, patient would like to preserve as much motion at the left wrist as possible moving forward.  Understanding this, we will move forward with left wrist proximal row carpectomy and  possible allograft spacer based on the appearance of the capitate intraoperatively.  I also discussed the PIN neurectomy that we can perform as part of this procedure as well for postoperative pain control.  Patient expressed understanding.  OPERATIVE COURSE: Patient was seen and identified in the preoperative area and marked appropriately.  Surgical consent had been signed. Preoperative IV antibiotic prophylaxis was given. He was transferred to the operating room and placed in supine position with the left upper extremity on an arm board.  Sedation was induced by the anesthesiologist. A regional block had been performed by anesthesia in preoperative holding.    Left upper extremity was prepped and draped in normal sterile orthopedic fashion.  A surgical pause was performed between the surgeons, anesthesia, and operating room staff and all were in agreement as to the patient, procedure, and site of procedure.  Tourniquet was placed and padded appropriately to the left upper arm.  Patient was seen and identified in the preoperative area and marked appropriately.  Surgical consent had been signed. Preoperative IV antibiotic prophylaxis was given. He was transferred to the operating room and placed in supine position with the left upper extremity on an arm board.  Sedation was induced by the anesthesiologist. A regional block had been performed by anesthesia in preoperative holding.    Left upper extremity was prepped and draped in normal sterile orthopedic fashion.  A surgical pause was performed between the surgeons, anesthesia, and operating room staff and all were in agreement as to the patient, procedure, and site of procedure.  Tourniquet was placed and padded appropriately to left upper arm.   The arm was exsanguinated the tourniquet was inflated to 250 mmHg.  A longitudinal incision  was designed over the dorsal aspect of the left wrist just ulnar to Lister's tubercle.  Blunt dissection was performed down,  exposure was performed between the second and fourth extensor compartments. Retinaculum was incised to allow for transposition of the EPL tendon. Within the fourth extensor compartment, we identified the posterior interosseous nerve, which was cauterized, 1 cm was transected as well for pain control purposes.  Once PIN neurectomy was completed, attention was returned to the wrist.  Wrist capsule was incised in longitudinal fashion, T-shaped incision was performed at the level of the distal radius to create appropriate capsular flaps.   Proximal row of the carpus was identified and confirmed with fluoroscopy.  Utilizing K wire as a joystick, and with use of the McGlamery instrument, lunate was first excised followed by excision of both the triquetrum and the scaphoid, care was taken to avoid injury to the radial scaphoid capitate ligament.  Appropriate proximal row carpectomy was then confirmed with fluoroscopy.  Copious irrigation was performed of the wrist joint.  Capitate surface was then inspected, there was slight chondral wear notable at the capitate surface, decision was made to proceed with the allograft spacer as planned.   The allograft spacer was then selected on the back table, we utilized the 3.0 mm thickness Arthroflex material.  This was folded over to create a 6.0 mm thickness spacer, which was then temporarily inserted into the wrist joint to be sized appropriately.  Spacer was then cut down to the appropriate length, 0 FiberWire was utilized to suture the folded over spacer together to prevent shear stress and maintain thickness.   0 FiberWire was then utilized to secure the spacer to the volar capsular tissue of the wrist.  This was followed by placement of a 3.5 mm swivel lock in the nonarticular portion of the capitate to create a box like configuration for security of the allograft spacer.  2-0 FiberWire was seated within the swivel lock, this was then secured to the dorsal aspect of the  spacer within the wrist joint.  Gentle range of motion of the wrist with flexion extension was performed to confirm appropriate security and stability of the spacer.  No extrusion of the spacer was noted with flexion and extension of the wrist.   After copious irrigation, capsular closure was then performed utilizing 0 Vicryl in figure-of-eight fashion.  Tourniquet was deflated and bipolar electrocautery utilized for hemostasis.  Tourniquet time was 61 minutes.  This followed by retinacular closure utilizing 3-0 Vicryl as well, leaving EPL transposed.  Layered closure was then performed utilizing 3-0 Vicryl for the subcutaneous layer and 4-0 nylon for the skin surface over the wrist.  The tourniquet was deflated at 61 minutes.  Fingertips were pink with brisk capillary refill after deflation of tourniquet.  The operative drapes were broken down.  The patient was awoken from anesthesia safely and taken to PACU in stable condition.     Post-operative plan: The patient will recover in the post-anesthesia care unit and then be discharged home.  The patient will be non weight bearing on the left upper extremity in a short arm splint.   I will see the patient back in the office in 2 weeks for postoperative followup.    Sophie Quiles, MD Electronically signed, 03/10/24

## 2024-03-10 NOTE — Anesthesia Preprocedure Evaluation (Addendum)
 "                                  Anesthesia Evaluation  Patient identified by MRN, date of birth, ID band Patient awake    Reviewed: Allergy & Precautions, H&P , NPO status , Patient's Chart, lab work & pertinent test results  Airway Mallampati: II  TM Distance: >3 FB Neck ROM: Limited    Dental  (+) Dental Advisory Given, Teeth Intact   Pulmonary neg pulmonary ROS   Pulmonary exam normal breath sounds clear to auscultation       Cardiovascular hypertension, On Medications Normal cardiovascular exam Rhythm:Regular Rate:Normal  Echo 2020  1. The left ventricle has normal systolic function, with an ejection  fraction of 55-60%. The cavity size was normal. Left ventricular diastolic  Doppler parameters are consistent with impaired relaxation No evidence of  left ventricular regional wall motion abnormalities.   2. The right ventricle has normal systolic function. The cavity was  normal. There is no increase in right ventricular wall thickness.   3. Trivial pericardial effusion is present.   4. The mitral valve is normal in structure. Mild calcification of the mitral  valve leaflet. No evidence of mitral valve stenosis. Trivial regurgitation.   5. The tricuspid valve is normal in structure.   6. The aortic valve is tricuspid Mild calcification of the aortic valve.  no stenosis of the aortic valve.   7. The pulmonic valve was normal in structure.   8. The aortic root and ascending aorta are normal in size and structure.   9. The IVC is normal in size. No complete TR doppler jet so unable to  estimate PA systolic pressure.     Neuro/Psych  Headaches PSYCHIATRIC DISORDERS Anxiety     TIA   GI/Hepatic Neg liver ROS,GERD  Medicated,,  Endo/Other  negative endocrine ROS    Renal/GU negative Renal ROS     Musculoskeletal  (+) Arthritis ,    Abdominal  (+) + obese  Peds  Hematology   Anesthesia Other Findings   Reproductive/Obstetrics                               Anesthesia Physical Anesthesia Plan  ASA: 2  Anesthesia Plan: Regional   Post-op Pain Management: Tylenol  PO (pre-op)*   Induction:   PONV Risk Score and Plan: 1 and Ondansetron , Dexamethasone , Propofol  infusion, TIVA and Treatment may vary due to age or medical condition  Airway Management Planned: Natural Airway  Additional Equipment:   Intra-op Plan:   Post-operative Plan:   Informed Consent: I have reviewed the patients History and Physical, chart, labs and discussed the procedure including the risks, benefits and alternatives for the proposed anesthesia with the patient or authorized representative who has indicated his/her understanding and acceptance.     Dental advisory given  Plan Discussed with: CRNA  Anesthesia Plan Comments: (Risks of anesthesia explained at length. This includes, but is not limited to, sore throat, damage to teeth, lips gums, tongue and vocal cords, nausea and vomiting, reactions to medications, stroke, heart attack, and death. All patient questions were answered and the patient wishes to proceed. Risks of peripheral nerve block explained at length. This includes, but is not limited to, bleeding, infection, reactions to the medications, seizures, damage to surrounding structures, damage to nerves, permanent weakness, numbness, tingling and pain. All  patient questions were answered and patient wishes to proceed with nerve block. )         Anesthesia Quick Evaluation  "

## 2024-03-10 NOTE — Anesthesia Postprocedure Evaluation (Signed)
"   Anesthesia Post Note  Patient: Bradley Lambert  Procedure(s) Performed: CARPECTOMY (Left: Wrist) TRANSECTION, NERVE,  PERIPHERAL (Left: Wrist)     Patient location during evaluation: PACU Anesthesia Type: Regional Level of consciousness: awake and alert Pain management: pain level controlled Vital Signs Assessment: post-procedure vital signs reviewed and stable Respiratory status: spontaneous breathing Cardiovascular status: stable Anesthetic complications: no   No notable events documented.  Last Vitals:  Vitals:   03/10/24 1230 03/10/24 1238  BP: (!) 150/87 110/74  Pulse: (!) 56 63  Resp: 12 18  Temp:  (!) 36.3 C  SpO2: 92% 93%    Last Pain:  Vitals:   03/10/24 1238  TempSrc:   PainSc: 0-No pain                 Norleen Pope      "

## 2024-03-10 NOTE — Progress Notes (Signed)
Assisted Dr. Germeroth with left, axillary, ultrasound guided block. Side rails up, monitors on throughout procedure. See vital signs in flow sheet. Tolerated Procedure well. 

## 2024-03-10 NOTE — Transfer of Care (Signed)
 Immediate Anesthesia Transfer of Care Note  Patient: Bradley Lambert  Procedure(s) Performed: Procedures (LRB): CARPECTOMY (Left) TRANSECTION, NERVE,  PERIPHERAL (Left)  Patient Location: PACU  Anesthesia Type: MAC  Level of Consciousness: awake, alert , oriented and patient cooperative  Airway & Oxygen Therapy: Patient Spontanous Breathing and Patient connected to face mask oxygen  Post-op Assessment: Report given to PACU RN and Post -op Vital signs reviewed and stable  Post vital signs: Reviewed and stable  Complications: No apparent anesthesia complications  Last Vitals:  Vitals Value Taken Time  BP    Temp 36.1 C 03/10/24 12:09  Pulse 62 03/10/24 12:10  Resp 12 03/10/24 12:10  SpO2 90 % 03/10/24 12:10    Last Pain:  Vitals:   03/10/24 0839  TempSrc: Tympanic  PainSc: 0-No pain      Patients Stated Pain Goal: 6 (03/10/24 0839)  Complications: No notable events documented.

## 2024-03-10 NOTE — H&P (Signed)
 @LOGODEPT @  Bradley Lambert - 69 y.o. male MRN [id number]  Date of birth: 06/04/1955   HAND SURGERY H&P UPDATE   HPI: Patient is a 69 y.o. 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  He today for left wrist proximal carpectomy with possible placement of allograft spacer.  Patient denies any changes to their medical history or new systemic symptoms today.    Past Medical History:  Diagnosis Date   Anxiety    Arthritis    back-arthritis   BPH (benign prostatic hyperplasia)    Chronic low back pain    GERD (gastroesophageal reflux disease)    History of melanoma    Hypertension    Insomnia    Migraine    Prostate cancer (HCC) 12/01/2012   bx.x2- last- 6'14 -dx. prostate cancer   Vision disturbance    Wears glasses    Past Surgical History:  Procedure Laterality Date   COLONOSCOPY     CYST EXCISION     forehead- sebaceous   DERMOID CYST  EXCISION     x3 hands   EYE SURGERY     radiocaratotomyx4   FINGER GANGLION CYST EXCISION     HERNIA REPAIR     hernia x3   MASS EXCISION Right 04/17/2013   Procedure: RIGHT INDEX FINGER CYST REMOVAL;  Surgeon: Alm DELENA Hummer, MD;  Location: [facility];  Service: Orthopedics;  Laterality: Right;   MELANOMA EXCISION  12-01-12   mid.anterior chest -melanoma excised -11 yrs ago   PAROTIDECTOMY  08/14/2011   Procedure: PAROTIDECTOMY;  Surgeon: Lonni FORBES Angle, MD;  Location: [facility];  Service: ENT;  Laterality: Left;  EXCISION PAROTID MASS WITH FACIAL NERVE DISSECTION   ROBOT ASSISTED LAPAROSCOPIC RADICAL PROSTATECTOMY N/A 12/08/2012   Procedure: ROBOTIC ASSISTED LAPAROSCOPIC RADICAL PROSTATECTOMY LEVEL 3;  Surgeon: Noretta Ferrara, MD;  Location: [facility];  Service: Urology;  Laterality: N/A;   ROTATOR CUFF REPAIR Right    TONSILLECTOMY     Social History   Socioeconomic History   Marital status: Married    Spouse name: Not on file   Number of children:  2   Years of education: college   Highest education level: Bachelor's degree (e.g., BA, AB, BS)  Occupational History   Occupation: retired  Tobacco Use   Smoking status: Never   Smokeless tobacco: Never  Substance and Sexual Activity   Alcohol use: Yes    Alcohol/week: 4.0 standard drinks of alcohol    Types: 4 Shots of liquor per week   Drug use: No   Sexual activity: Yes  Other Topics Concern   Not on file  Social History Narrative   Lives with wife.   Right-handed.   1-2 cups caffeine daily..   Social Drivers of Health   Tobacco Use: Low Risk (03/03/2024)   Patient History    Smoking Tobacco Use: Never    Smokeless Tobacco Use: Never    Passive Exposure: Not on file  Financial Resource Strain: Not on file  Food Insecurity: Not on file  Transportation Needs: Not on file  Physical Activity: Not on file  Stress: Not on file  Social Connections: Not on file  Depression (EYV7-0): Not on file  Alcohol Screen: Not on file  Housing: Not on file  Utilities: Not on file  Health Literacy: Not on file   Family History  Problem Relation Age of Onset   Pneumonia Mother    Esophageal cancer Father  Thyroid  disease Sister    Cancer Brother        eye   Heart attack Brother    Diabetes Brother    Hypertension Brother    CAD Brother    Diverticulitis Maternal Grandfather    Crohn's disease Maternal Aunt    Heart disease Maternal Grandmother    - negative except otherwise stated in the family history section Allergies[1] Prior to Admission medications  Medication Sig Start Date End Date Taking? Authorizing Provider  amLODipine (NORVASC) 10 MG tablet Take 10 mg by mouth daily.   Yes [provider]  atorvastatin (LIPITOR) 40 MG tablet Take 40 mg by mouth daily.   Yes [provider]  famotidine (PEPCID) 20 MG tablet Take 20 mg by mouth 2 (two) times daily.   Yes [provider]  gabapentin (NEURONTIN) 300 MG capsule Take 600 mg by mouth at  bedtime.   Yes [provider]  losartan (COZAAR) 100 MG tablet Take 100 mg by mouth daily.   Yes [provider]  meloxicam  (MOBIC ) 15 MG tablet Take 15 mg by mouth daily.   Yes [provider]  oxybutynin (DITROPAN-XL) 10 MG 24 hr tablet Take 10 mg by mouth at bedtime.   Yes [provider]  pantoprazole  (PROTONIX ) 40 MG tablet Take 40 mg by mouth daily.   Yes [provider]  tamsulosin (FLOMAX) 0.4 MG CAPS capsule Take 0.8 mg by mouth.   Yes [provider]  traZODone  (DESYREL ) 50 MG tablet Take 50 mg by mouth at bedtime.   Yes [provider]   No results found. - Positive ROS: All other systems have been reviewed and were otherwise negative with the exception of those mentioned in the HPI and as above.  Physical Exam: General: No acute distress, resting comfortably Cardiovascular: BUE warm and well perfused, normal rate Respiratory: Normal WOB on RA Skin: Warm and dry Neurologic: Sensation intact distally Psychiatric: Patient is at baseline mood and affect  General: Patient is well appearing and in no distress.   Skin and Muscle: No significant skin changes are apparent to hands.  Muscle bulk and contour normal, no signs of atrophy.      Range of Motion and Palpation Tests: Mobility is full about the elbows with flexion and extension.  Forearm supination and pronation are 85/85 bilaterally.  Wrist flexion/extension is 75/65 bilaterally.  Digital flexion and extension are full.  Thumb opposition is full to the base of the small fingers bilaterally.     No cords or nodules are palpated.  No triggering is observed.     Significant tenderness over the left wrist scapholunate interval.  Notable pain and clunk with Mathew shift testing.  Watson shift testing is negative on the right wrist.     Minimal tenderness over the thumb CMC articulations is observed. Ulnar impingement test is negative bilaterally.   Neurologic,  Vascular, Motor: Sensation is intact to light touch in the median/radial/ulnar distributions. Fingers pink and well perfused.  Capillary refill is brisk.     Assessment/Plan: OR today for left wrist proximal carpectomy and possible allograft spacer, associated PIN neurectomy. We again reviewed the risks of surgery which include bleeding, infection, damage to neurovascular structures, persistent symptoms, need for additional surgery.  Informed consent was signed.  All questions were answered.   Sammye Staff [facility], Hand Surgery      [1]  Allergies Allergen Reactions   Prednisone     Nerve pain

## 2024-03-10 NOTE — Anesthesia Procedure Notes (Signed)
 Procedure Name: MAC Date/Time: 03/10/2024 10:29 AM  Performed by: Delayne Olam BIRCH, CRNAPre-anesthesia Checklist: Patient identified, Emergency Drugs available, Suction available and Patient being monitored Oxygen Delivery Method: Simple face mask

## 2024-03-10 NOTE — Anesthesia Procedure Notes (Signed)
 Anesthesia Regional Block: Axillary brachial plexus block   Pre-Anesthetic Checklist: , timeout performed,  Correct Patient, Correct Site, Correct Laterality,  Correct Procedure, Correct Position, site marked,  Risks and benefits discussed,  Surgical consent,  Pre-op evaluation,  At surgeon's request and post-op pain management  Laterality: Upper and Left  Prep: chloraprep       Needles:  Injection technique: Single-shot  Needle Type: Stimiplex          Additional Needles:   Procedures:,,,, ultrasound used (permanent image in chart),,    Narrative:  Start time: 03/10/2024 8:56 AM End time: 03/10/2024 9:16 AM Injection made incrementally with aspirations every 5 mL.  Performed by: Personally  Anesthesiologist: Darlyn Rush, MD  Additional Notes: BP cuff, SpO2 and EKG monitors applied. Sedation begun. Nerve location verified with ultrasound. Anesthetic injected incrementally, slowly, and after neg aspirations under direct u/s guidance. Good perineural spread. Tolerated well.

## 2024-03-10 NOTE — Discharge Instructions (Addendum)
 "   Hand Surgery Postop Instructions   Dressings: Maintain postoperative dressing until orthopedic follow-up.  Keep operative site clean and dry until orthopedic follow-up.  Wound Care: Keep your hand elevated above the level of your heart.  Do not allow it to dangle by your side. Moving your fingers is advised to stimulate circulation but will depend on the site of your surgery.  If you have a splint applied, your doctor will advise you regarding movement.  Activity: Do not drive or operate machinery until clearance given from physician. No heavy lifting with operative extremity.  Diet:  Drink liquids today or eat a light diet.  You may resume a regular diet tomorrow.    General expectations: Take prescribed medication if given, transition to over-the-counter medication as quickly as possible. Fingers may become slightly swollen.  Call your doctor if any of the following occur: Severe pain not relieved by pain medication. Elevated temperature. Dressing soaked with blood. Inability to move fingers. White or bluish color to fingers.   Per [facility] clinic policy, our goal is ensure optimal postoperative pain control with a multimodal pain management strategy. For all [facility] patients, our goal is to wean post-operative narcotic medications by 6 weeks post-operatively. If this is not possible due to utilization of pain medication prior to surgery, your [facility] doctor will support your acute post-operative pain control for the first 6 weeks postoperatively, with a plan to transition you back to your primary pain team following that. Maralee will work to ensure a therapist, occupational.  Anshul Afton Alderton, M.D. Hand Surgery [facility]      Post Anesthesia Home Care Instructions  Activity: Get plenty of rest for the remainder of the day. A responsible individual must stay with you for 24 hours following the procedure.  For the next 24 hours, DO NOT: -Drive a  car -Advertising copywriter -Drink alcoholic beverages -Take any medication unless instructed by your physician -Make any legal decisions or sign important papers.  Meals: Start with liquid foods such as gelatin or soup. Progress to regular foods as tolerated. Avoid greasy, spicy, heavy foods. If nausea and/or vomiting occur, drink only clear liquids until the nausea and/or vomiting subsides. Call your physician if vomiting continues.  Special Instructions/Symptoms: Your throat may feel dry or sore from the anesthesia or the breathing tube placed in your throat during surgery. If this causes discomfort, gargle with warm salt water . The discomfort should disappear within 24 hours.  If you had a scopolamine patch placed behind your ear for the management of post- operative nausea and/or vomiting:  1. The medication in the patch is effective for 72 hours, after which it should be removed.  Wrap patch in a tissue and discard in the trash. Wash hands thoroughly with soap and water . 2. You may remove the patch earlier than 72 hours if you experience unpleasant side effects which may include dry mouth, dizziness or visual disturbances. 3. Avoid touching the patch. Wash your hands with soap and water  after contact with the patch.   Regional Anesthesia Blocks  1. You may not be able to move or feel the blocked extremity after a regional anesthetic block. This may last may last from 3-48 hours after placement, but it will go away. The length of time depends on the medication injected and your individual response to the medication. As the nerves start to wake up, you may experience tingling as the movement and feeling returns to your extremity. If the numbness and inability  to move your extremity has not gone away after 48 hours, please call your surgeon.   2. The extremity that is blocked will need to be protected until the numbness is gone and the strength has returned. Because you cannot feel it, you  will need to take extra care to avoid injury. Because it may be weak, you may have difficulty moving it or using it. You may not know what position it is in without looking at it while the block is in effect.  3. For blocks in the legs and feet, returning to weight bearing and walking needs to be done carefully. You will need to wait until the numbness is entirely gone and the strength has returned. You should be able to move your leg and foot normally before you try and bear weight or walk. You will need someone to be with you when you first try to ensure you do not fall and possibly risk injury.  4. Bruising and tenderness at the needle site are common side effects and will resolve in a few days.  5. Persistent numbness or new problems with movement should be communicated to the surgeon or the [facility] [phone] [facility] [phone]).    Next dose of Tylenol  may be given at   2:50pm if needed. "

## 2024-03-14 ENCOUNTER — Encounter (HOSPITAL_BASED_OUTPATIENT_CLINIC_OR_DEPARTMENT_OTHER): Payer: Self-pay | Admitting: Orthopedic Surgery

## 2024-03-23 ENCOUNTER — Other Ambulatory Visit: Payer: Self-pay

## 2024-03-23 ENCOUNTER — Ambulatory Visit: Admitting: Orthopedic Surgery

## 2024-03-23 DIAGNOSIS — M25332 Other instability, left wrist: Secondary | ICD-10-CM

## 2024-03-23 NOTE — Progress Notes (Signed)
" ° °  Bradley Lambert - 69 y.o. male MRN [id number]  Date of birth: 1955-07-15  Office Visit Note: Visit Date: 03/23/2024 PCP: Crecencio Chiquita POUR, FNP Referred by: Cleotilde Planas, MD  Subjective:  HPI: Bradley Lambert is a 69 y.o. male who presents today for follow up 2 weeks status post wrist proximal row carpectomy with associated allograft spacer.  Doing well overall, pain is well-controlled.  Pertinent ROS were reviewed with the patient and found to be negative unless otherwise specified above in HPI.   Assessment & Plan: Visit Diagnoses:  1. Scapholunate dissociation of left wrist     Plan: He is doing well postoperatively.  Pain is well-controlled.  Does have some moderate swelling throughout the wrist region.  Will be transition today to a short arm cast for the wrist level.  Follow-up in 2 weeks.  Follow-up: No follow-ups on file.   Meds & Orders: No orders of the defined types were placed in this encounter.   Orders Placed This Encounter  Procedures   XR Wrist Complete Left     Procedures: No procedures performed       Objective:   Vital Signs: There were no vitals taken for this visit.  Ortho Exam Right wrist with well-healed dorsal incision, sutures removed today, skin edges well-approximated without erythema or drainage, moderate swelling, sensation intact distally, hand remains warm well-perfused  Imaging: No results found. X-ray of the left wrist obtained today  Mireyah Chervenak Estela) Clemmie Marxen, M.D. [facility], Hand Surgery  "

## 2024-04-06 ENCOUNTER — Encounter: Admitting: Orthopedic Surgery

## 2024-04-07 ENCOUNTER — Encounter: Admitting: Rehabilitative and Restorative Service Providers"
